# Patient Record
Sex: Male | Born: 1990 | Hispanic: Yes | Marital: Married | Smoking: Current every day smoker
Health system: Southern US, Community
[De-identification: ages and names within clinical notes are randomized; demographics above are authoritative.]

---

## 2013-07-27 ENCOUNTER — Observation Stay (HOSPITAL_COMMUNITY)
Admission: EM | Admit: 2013-07-27 | Discharge: 2013-07-28 | Disposition: A | Discharge status: Home / Self Care | Payer: Self-pay | Attending: General Surgery | Admitting: General Surgery

## 2013-07-27 ENCOUNTER — Emergency Department (HOSPITAL_COMMUNITY): Payer: Self-pay

## 2013-07-27 ENCOUNTER — Encounter (HOSPITAL_COMMUNITY): Payer: Self-pay | Admitting: Emergency Medicine

## 2013-07-27 ENCOUNTER — Emergency Department (HOSPITAL_COMMUNITY): Payer: MEDICAID

## 2013-07-27 DIAGNOSIS — S3681XA Injury of peritoneum, initial encounter: Secondary | ICD-10-CM

## 2013-07-27 DIAGNOSIS — S3600XA Unspecified injury of spleen, initial encounter: Principal | ICD-10-CM | POA: Insufficient documentation

## 2013-07-27 DIAGNOSIS — M25512 Pain in left shoulder: Secondary | ICD-10-CM | POA: Diagnosis present

## 2013-07-27 DIAGNOSIS — M25519 Pain in unspecified shoulder: Secondary | ICD-10-CM | POA: Insufficient documentation

## 2013-07-27 DIAGNOSIS — F17200 Nicotine dependence, unspecified, uncomplicated: Secondary | ICD-10-CM | POA: Insufficient documentation

## 2013-07-27 DIAGNOSIS — S36039A Unspecified laceration of spleen, initial encounter: Secondary | ICD-10-CM

## 2013-07-27 DIAGNOSIS — W138XXA Fall from, out of or through other building or structure, initial encounter: Secondary | ICD-10-CM | POA: Insufficient documentation

## 2013-07-27 DIAGNOSIS — Y990 Civilian activity done for income or pay: Secondary | ICD-10-CM | POA: Insufficient documentation

## 2013-07-27 DIAGNOSIS — S270XXA Traumatic pneumothorax, initial encounter: Secondary | ICD-10-CM

## 2013-07-27 DIAGNOSIS — S3692XA Contusion of unspecified intra-abdominal organ, initial encounter: Secondary | ICD-10-CM

## 2013-07-27 DIAGNOSIS — W19XXXA Unspecified fall, initial encounter: Secondary | ICD-10-CM

## 2013-07-27 DIAGNOSIS — IMO0001 Reserved for inherently not codable concepts without codable children: Secondary | ICD-10-CM

## 2013-07-27 DIAGNOSIS — J939 Pneumothorax, unspecified: Secondary | ICD-10-CM

## 2013-07-27 LAB — COMPREHENSIVE METABOLIC PANEL
ALK PHOS: 66 U/L (ref 39–117)
ALT: 252 U/L — ABNORMAL HIGH (ref 0–53)
AST: 326 U/L — ABNORMAL HIGH (ref 0–37)
Albumin: 4.3 g/dL (ref 3.5–5.2)
BUN: 15 mg/dL (ref 6–23)
CHLORIDE: 105 meq/L (ref 96–112)
CO2: 19 mEq/L (ref 19–32)
CREATININE: 0.87 mg/dL (ref 0.50–1.35)
Calcium: 9.8 mg/dL (ref 8.4–10.5)
GFR calc non Af Amer: >90 mL/min (ref >90)
GLUCOSE: 87 mg/dL (ref 70–99)
POTASSIUM: 3.8 meq/L (ref 3.7–5.3)
Sodium: 145 mEq/L (ref 137–147)
Total Bilirubin: 0.3 mg/dL (ref 0.3–1.2)
Total Protein: 7.9 g/dL (ref 6.0–8.3)

## 2013-07-27 LAB — SAMPLE TO BLOOD BANK

## 2013-07-27 LAB — I-STAT CG4 LACTIC ACID, ED: Lactic Acid, Venous: 2.88 mmol/L — ABNORMAL HIGH (ref 0.5–2.2)

## 2013-07-27 LAB — CBC
HCT: 47.7 % (ref 39.0–52.0)
Hemoglobin: 16.3 g/dL (ref 13.0–17.0)
MCH: 29 pg (ref 26.0–34.0)
MCHC: 34.2 g/dL (ref 30.0–36.0)
MCV: 84.7 fL (ref 78.0–100.0)
Platelets: 320 10*3/uL (ref 150–400)
RBC: 5.63 MIL/uL (ref 4.22–5.81)
RDW: 13.8 % (ref 11.5–15.5)
WBC: 10.9 10*3/uL — AB (ref 4.0–10.5)

## 2013-07-27 LAB — CDS SEROLOGY

## 2013-07-27 LAB — PROTIME-INR
INR: 0.98 (ref 0.00–1.49)
Prothrombin Time: 13 seconds (ref 11.6–15.2)

## 2013-07-27 LAB — ETHANOL: ALCOHOL ETHYL (B): 69 mg/dL — AB (ref 0–11)

## 2013-07-27 MED ORDER — MORPHINE SULFATE 2 MG/ML IJ SOLN: 1.0000 mg | INTRAMUSCULAR | Status: DC | PRN

## 2013-07-27 MED ORDER — MORPHINE SULFATE 2 MG/ML IJ SOLN
2.0000 mg | INTRAMUSCULAR | Status: DC | PRN
  Administered 2013-07-27 – 2013-07-28 (×2): 2 mg via INTRAVENOUS
  Filled 2013-07-27 (×2): qty 1

## 2013-07-27 MED ORDER — MORPHINE SULFATE 4 MG/ML IJ SOLN: 4.0000 mg | INTRAMUSCULAR | Status: DC | PRN

## 2013-07-27 MED ORDER — IOHEXOL 300 MG/ML  SOLN
100.0000 mL | Freq: Once | INTRAMUSCULAR | Status: AC | PRN
  Administered 2013-07-27: 100 mL via INTRAVENOUS

## 2013-07-27 MED ORDER — HYDROMORPHONE HCL PF 1 MG/ML IJ SOLN
INTRAMUSCULAR | Status: AC
  Filled 2013-07-27: qty 1

## 2013-07-27 MED ORDER — POTASSIUM CHLORIDE IN NACL 20-0.9 MEQ/L-% IV SOLN
INTRAVENOUS | Status: DC
  Administered 2013-07-27 – 2013-07-28 (×2): via INTRAVENOUS
  Filled 2013-07-27 (×3): qty 1000

## 2013-07-27 MED ORDER — HYDROMORPHONE HCL PF 1 MG/ML IJ SOLN
1.0000 mg | Freq: Once | INTRAMUSCULAR | Status: AC
Start: 2013-07-27 — End: 2013-07-27
  Administered 2013-07-27: 1 mg via INTRAVENOUS

## 2013-07-27 MED ORDER — ONDANSETRON HCL 4 MG/2ML IJ SOLN
4.0000 mg | Freq: Four times a day (QID) | INTRAMUSCULAR | Status: DC | PRN
  Administered 2013-07-27: 4 mg via INTRAVENOUS
  Filled 2013-07-27: qty 2

## 2013-07-27 MED ORDER — SODIUM CHLORIDE 0.9 % IV BOLUS (SEPSIS): 500.0000 mL | INTRAVENOUS | Status: AC

## 2013-07-27 MED ORDER — ONDANSETRON HCL 4 MG PO TABS: 4.0000 mg | ORAL_TABLET | Freq: Four times a day (QID) | ORAL | Status: DC | PRN

## 2013-07-27 MED ORDER — SODIUM CHLORIDE 0.9 % IV SOLN
INTRAVENOUS | Status: AC | PRN
  Administered 2013-07-27: 500 mL via INTRAVENOUS

## 2013-07-27 NOTE — ED Notes (Signed)
Pt returned to room from radiology, placed back on monitor, family at bedside and updated

## 2013-07-27 NOTE — ED Provider Notes (Signed)
CSN: [id number]     Arrival date & time 07/27/13  1731 History   First MD Initiated Contact with Patient 07/27/13 1741     Chief Complaint  Patient presents with  . Trauma  . Fall     (Consider location/radiation/quality/duration/timing/severity/associated sxs/prior Treatment) Patient is a 23 y.o. male presenting with fall.  Fall This is a new problem. The current episode started today. The problem has been unchanged. Associated symptoms include abdominal pain. Pertinent negatives include no chest pain, chills, congestion, coughing, fever, headaches, neck pain, numbness or vomiting. Nothing aggravates the symptoms. He has tried nothing for the symptoms.    History reviewed. No pertinent past medical history. History reviewed. No pertinent past surgical history. History reviewed. No pertinent family history. History  Substance Use Topics  . Smoking status: Current Every Day Smoker  . Smokeless tobacco: Not on file  . Alcohol Use: Not on file    Review of Systems  Constitutional: Negative for fever and chills.  HENT: Negative for congestion and rhinorrhea.   Eyes: Negative for pain.  Respiratory: Negative for cough and shortness of breath.   Cardiovascular: Negative for chest pain and palpitations.  Gastrointestinal: Positive for abdominal pain. Negative for vomiting, diarrhea and constipation.  Endocrine: Negative for polydipsia and polyuria.  Genitourinary: Negative for dysuria and flank pain.  Musculoskeletal: Negative for back pain and neck pain.  Skin: Negative for color change and wound.  Neurological: Negative for dizziness, numbness and headaches.      Allergies  Review of patient's allergies indicates no known allergies.  Home Medications   Prior to Admission medications   Medication Sig Start Date End Date Taking? Authorizing Provider  traMADol (ULTRAM) 50 MG tablet Take 1-2 tablets (50-100 mg total) by mouth every 6 (six) hours as needed (50mg  for mild pain,  75mg  for moderate pain, 100mg  for severe pain). 07/28/13   Megan Dort, PA-C   BP 114/58  Pulse 58  Temp(Src) 98.1 F (36.7 C) (Oral)  Resp 16  Ht 5\' 8"  (1.727 m)  Wt 170 lb (77.111 kg)  BMI 25.85 kg/m2  SpO2 100% Physical Exam  Nursing note and vitals reviewed. Constitutional: He is oriented to person, place, and time. He appears well-developed and well-nourished.  HENT:  Head: Normocephalic and atraumatic.  Eyes: Conjunctivae and EOM are normal. Pupils are equal, round, and reactive to light.  Neck: Normal range of motion.  Cardiovascular: Normal rate and regular rhythm.   Pulmonary/Chest: Effort normal and breath sounds normal.  Abdominal: Soft. He exhibits no distension. There is tenderness (luq).  Musculoskeletal: Normal range of motion. He exhibits tenderness (left flank). He exhibits no edema.  Neurological: He is alert and oriented to person, place, and time.  Skin: Skin is warm and dry.    ED Course  Procedures (including critical care time) Labs Review Labs Reviewed  COMPREHENSIVE METABOLIC PANEL - Abnormal; Notable for the following:    AST 326 (*)    ALT 252 (*)    All other components within normal limits  CBC - Abnormal; Notable for the following:    WBC 10.9 (*)    All other components within normal limits  ETHANOL - Abnormal; Notable for the following:    Alcohol, Ethyl (B) 69 (*)    All other components within normal limits  URINALYSIS, ROUTINE W REFLEX MICROSCOPIC - Abnormal; Notable for the following:    APPearance CLOUDY (*)    Specific Gravity, Urine 1.044 (*)    Hgb urine dipstick MODERATE (*)  Ketones, ur 15 (*)    All other components within normal limits  CBC - Abnormal; Notable for the following:    WBC 10.9 (*)    All other components within normal limits  I-STAT CG4 LACTIC ACID, ED - Abnormal; Notable for the following:    Lactic Acid, Venous 2.88 (*)    All other components within normal limits  CDS SEROLOGY  PROTIME-INR  BASIC  METABOLIC PANEL  URINE MICROSCOPIC-ADD ON  CBC  SAMPLE TO BLOOD BANK    Imaging Review Dg Ac Joints  07/28/2013   CLINICAL DATA:  Fall.  Left shoulder pain.  EXAM: LEFT ACROMIOCLAVICULAR JOINTS  COMPARISON:  None.  FINDINGS: AC joint imaging of both AC joints with and without weights shows no separation or malalignment. There is no change between the without and with weight images. There are no fractures or bone lesions. No degenerative changes are seen.  IMPRESSION: Negative exam.   Electronically Signed   By: Amie Portlandavid  Ormond M.D.   On: 07/28/2013 11:32   Ct Head Wo Contrast  07/27/2013   CLINICAL DATA:  Larey SeatFell from roof about 20 feet with low back pain and pelvic pain  EXAM: CT HEAD WITHOUT CONTRAST  CT CERVICAL SPINE WITHOUT CONTRAST  TECHNIQUE: Multidetector CT imaging of the head and cervical spine was performed following the standard protocol without intravenous contrast. Multiplanar CT image reconstructions of the cervical spine were also generated.  COMPARISON:  None.  FINDINGS: CT HEAD FINDINGS  No skull fracture. Mild inflammatory change in the maxillary sinuses. Intracranial E there is no evidence of hemorrhage or extra-axial fluid. No mass or infarct. No hydrocephalus.  CT CERVICAL SPINE FINDINGS  No cervical spine alignment. No prevertebral soft tissue swelling. No fracture.  IMPRESSION: Negative CT of the head and cervical spine.   Electronically Signed   By: Esperanza Heiraymond  Rubner M.D.   On: 07/27/2013 18:30   Ct Chest W Contrast  07/27/2013   CLINICAL DATA:  Larey SeatFell from roof  EXAM: CT CHEST, ABDOMEN, AND PELVIS WITH CONTRAST  TECHNIQUE: Multidetector CT imaging of the chest, abdomen and pelvis was performed following the standard protocol during bolus administration of intravenous contrast.  CONTRAST:  100mL OMNIPAQUE IOHEXOL 300 MG/ML  SOLN  COMPARISON:  None.  FINDINGS: CT CHEST FINDINGS  No pleural effusion identified. Dependent changes are noted in the left base. There is a tiny left anterior  pneumothorax. No pulmonary contusion.  The heart size is normal. No mediastinal hematoma. There is no enlarged mediastinal or hilar lymph nodes. No enlarged axillary or supraclavicular lymph nodes.  Review of the visualized bony structures is negative for fracture or dislocation.  CT ABDOMEN AND PELVIS FINDINGS  There is no focal liver abnormality. The gallbladder appears normal. No biliary dilatation. Normal appearance of the pancreas. Several tiny foci of low-attenuation are identified within the parenchyma of the spleen vertically within the inferior aspect of the spleen. There is a small amount of fluid identified along the inferior margin of the spleen which is best seen on the coronal images. Findings are suspicious for splenic laceration or contusion, image 54/series 5.  The adrenal glands are both normal. Normal appearance of both kidneys. The urinary bladder appears normal. The prostate gland and seminal vesicles are normal.  Normal caliber of the abdominal aorta. No aneurysm. No upper abdominal adenopathy. There is no pelvic or inguinal adenopathy.  The stomach is normal. The small bowel loops have a normal course and caliber. The appendix is visualized and appears  normal. Focal area of increased attenuation within the omentum fat of the right abdomen along the undersurface of the ventral abdominal wall is identified measuring 1.3 x 2.7 cm, image 75/series 2. The colon is otherwise on unremarkable.  Review of the visualized osseous structures is negative for acute fracture.  IMPRESSION: 1. Tiny left anterior pneumothorax. 2. There is a focal area increased attenuation omentum within the right abdomen is favored to represent sequelae of contusion and hematoma formation. 3. Suspect mild splenic laceration or contusion with trace amount of hemo peritoneum.  Critical Value/emergent results were called by telephone at the time of interpretation on 07/27/2013 at 6:42 PM to Dr. Randa Spike, Romeo Apple , who verbally  acknowledged these results.   Electronically Signed   By: Signa Kell M.D.   On: 07/27/2013 18:42   Ct Cervical Spine Wo Contrast  07/27/2013   CLINICAL DATA:  Larey Seat from roof about 20 feet with low back pain and pelvic pain  EXAM: CT HEAD WITHOUT CONTRAST  CT CERVICAL SPINE WITHOUT CONTRAST  TECHNIQUE: Multidetector CT imaging of the head and cervical spine was performed following the standard protocol without intravenous contrast. Multiplanar CT image reconstructions of the cervical spine were also generated.  COMPARISON:  None.  FINDINGS: CT HEAD FINDINGS  No skull fracture. Mild inflammatory change in the maxillary sinuses. Intracranial E there is no evidence of hemorrhage or extra-axial fluid. No mass or infarct. No hydrocephalus.  CT CERVICAL SPINE FINDINGS  No cervical spine alignment. No prevertebral soft tissue swelling. No fracture.  IMPRESSION: Negative CT of the head and cervical spine.   Electronically Signed   By: Esperanza Heir M.D.   On: 07/27/2013 18:30   Ct Abdomen Pelvis W Contrast  07/27/2013   CLINICAL DATA:  Larey Seat from roof  EXAM: CT CHEST, ABDOMEN, AND PELVIS WITH CONTRAST  TECHNIQUE: Multidetector CT imaging of the chest, abdomen and pelvis was performed following the standard protocol during bolus administration of intravenous contrast.  CONTRAST:  OMNIPAQUE IOHEXOL 300 MG/ML  SOLN  COMPARISON:  None.  FINDINGS: CT CHEST FINDINGS  No pleural effusion identified. Dependent changes are noted in the left base. There is a tiny left anterior pneumothorax. No pulmonary contusion.  The heart size is normal. No mediastinal hematoma. There is no enlarged mediastinal or hilar lymph nodes. No enlarged axillary or supraclavicular lymph nodes.  Review of the visualized bony structures is negative for fracture or dislocation.  CT ABDOMEN AND PELVIS FINDINGS  There is no focal liver abnormality. The gallbladder appears normal. No biliary dilatation. Normal appearance of the pancreas. Several  tiny foci of low-attenuation are identified within the parenchyma of the spleen vertically within the inferior aspect of the spleen. There is a small amount of fluid identified along the inferior margin of the spleen which is best seen on the coronal images. Findings are suspicious for splenic laceration or contusion, image 54/series 5.  The adrenal glands are both normal. Normal appearance of both kidneys. The urinary bladder appears normal. The prostate gland and seminal vesicles are normal.  Normal caliber of the abdominal aorta. No aneurysm. No upper abdominal adenopathy. There is no pelvic or inguinal adenopathy.  The stomach is normal. The small bowel loops have a normal course and caliber. The appendix is visualized and appears normal. Focal area of increased attenuation within the omentum fat of the right abdomen along the undersurface of the ventral abdominal wall is identified measuring 1.3 x 2.7 cm, image 75/series 2. The colon is otherwise  on unremarkable.  Review of the visualized osseous structures is negative for acute fracture.  IMPRESSION: 1. Tiny left anterior pneumothorax. 2. There is a focal area increased attenuation omentum within the right abdomen is favored to represent sequelae of contusion and hematoma formation. 3. Suspect mild splenic laceration or contusion with trace amount of hemo peritoneum.  Critical Value/emergent results were called by telephone at the time of interpretation on 07/27/2013 at 6:42 PM to Dr. Randa SpikeFORREST, Romeo AppleHARRISON , who verbally acknowledged these results.   Electronically Signed   By: Signa Kellaylor  Stroud M.D.   On: 07/27/2013 18:42   Dg Pelvis Portable  07/27/2013   CLINICAL DATA:  Trauma.  Pelvic pain.  EXAM: PORTABLE PELVIS 1-2 VIEWS  COMPARISON:  None.  FINDINGS: There is no evidence of pelvic fracture or diastasis. No other pelvic bone lesions are seen.  IMPRESSION: Negative.   Electronically Signed   By: Myles RosenthalJohn  Stahl M.D.   On: 07/27/2013 18:49   Dg Chest Port 1  View  07/28/2013   CLINICAL DATA:  LEFT apical pneumothorax, patient feeling better  EXAM: PORTABLE CHEST - 1 VIEW  COMPARISON:  Portable exam 0727 hr compared to 07/27/2013  FINDINGS: Upper normal heart size.  Normal mediastinal contours and pulmonary vascularity.  Lungs clear.  No pulmonary infiltrate, pleural effusion, or pneumothorax.  Bones unremarkable.  IMPRESSION: No acute abnormalities.   Electronically Signed   By: Ulyses SouthwardMark  Boles M.D.   On: 07/28/2013 08:28   Dg Chest Port 1 View  07/27/2013   CLINICAL DATA:  Trauma.  EXAM: PORTABLE CHEST - 1 VIEW  COMPARISON:  None.  FINDINGS: Low lung volumes are noted, however both lungs are clear. No evidence of pneumothorax or hemothorax. No evidence of mediastinal widening or tracheal deviation. Heart size is within normal limits. Visualized skeletal structures are unremarkable.  IMPRESSION: Low lung volumes.  No acute findings.   Electronically Signed   By: Myles RosenthalJohn  Stahl M.D.   On: 07/27/2013 18:39   Dg Shoulder Left  07/28/2013   CLINICAL DATA:  Pain s/p fall  EXAM: LEFT SHOULDER - 2+ VIEW  COMPARISON:  None.  FINDINGS: There is no evidence of fracture or dislocation. There is no evidence of arthropathy or other focal bone abnormality. Soft tissues are unremarkable.  IMPRESSION: Negative.   Electronically Signed   By: Salome HolmesHector  Cooper M.D.   On: 07/28/2013 10:59     EKG Interpretation   Date/Time:  Tuesday July 27 2013 17:46:50 EDT Ventricular Rate:  94 PR Interval:  175 QRS Duration: 108 QT Interval:  328 QTC Calculation: 410 R Axis:   52 Text Interpretation:  Sinus rhythm Anterior infarct, acute (LAD) Lateral  leads are also involved ST elevation likely early repol in healthy young  male, no previous for comparison Confirmed by HARRISON  MD, FORREST ([id number])  on 07/27/2013 6:41:35 PM      MDM   Final diagnoses:  Fall, initial encounter  Pneumothorax, left  Splenic laceration, initial encounter  Abdominal hematoma, initial encounter    23  yo M w/o significant PMH here after 30 foot fall without LOC, but has amnesia to event, landed on abdomen and chest. HDS, Pain in luq and left lower back, nothing midline. Neuro grossly intact. Significant epigastric and luq ttp.  Imaging with ptx, splenic lac, ffp. Admitted to trauma in stable condition.     Marily MemosJason Mesner, MD 07/28/13 [id number]

## 2013-07-27 NOTE — H&P (Signed)
History   Perry [name] is an 23 y.o. male.   Chief Complaint:  Chief Complaint  Patient presents with  . Trauma  . Fall    Trauma Mechanism of injury: fall  Fall  this is [name] 23 year old Hispanic gentleman who apparently fell off [name] approximately 30 feet while working. He arrived as [name] level II trauma, hemodynamically stable, complaining of back and abdominal pain. He denies neck pain or shortness. There was no loss of consciousness. GCS is 15. He does not speak [place] but interpreters are present.  History reviewed. No pertinent past medical history.  History reviewed. No pertinent past surgical history.  History reviewed. No pertinent family history. Social History:  reports that he has been smoking.  He does not have any smokeless tobacco history on file. His alcohol and drug histories are not on file.  Allergies  No Known Allergies  Home Medications   (Not in [name] hospital admission)  Trauma Course   Results for orders placed during the hospital encounter of 07/27/13 (from the past 48 hour(s))  CDS SEROLOGY     Status: None   Collection Time    07/27/13  5:41 PM      Result Value Ref Range   CDS serology specimen       Value: SPECIMEN WILL BE HELD FOR 14 DAYS IF TESTING IS REQUIRED  COMPREHENSIVE METABOLIC PANEL     Status: Abnormal   Collection Time    07/27/13  5:41 PM      Result Value Ref Range   Sodium 145  137 - 147 mEq/L   Potassium 3.8  3.7 - 5.3 mEq/L   Chloride 105  96 - 112 mEq/L   CO2 19  19 - 32 mEq/L   Glucose, Bld 87  70 - 99 mg/dL   BUN 15  6 - 23 mg/dL   Creatinine, Ser 0.87  0.50 - 1.35 mg/dL   Calcium 9.8  8.4 - 10.5 mg/dL   Total Protein 7.9  6.0 - 8.3 g/dL   Albumin 4.3  3.5 - 5.2 g/dL   AST 326 (*) 0 - 37 U/L   ALT 252 (*) 0 - 53 U/L   Alkaline Phosphatase 66  39 - 117 U/L   Total Bilirubin 0.3  0.3 - 1.2 mg/dL   GFR calc non Af Amer >90  >90 mL/min   GFR calc Af Amer >90  >90 mL/min   Comment: (NOTE)     The eGFR has been  calculated using the CKD EPI equation.     This calculation has not been validated in all clinical situations.     eGFR's persistently <90 mL/min signify possible Chronic Kidney     Disease.  CBC     Status: Abnormal   Collection Time    07/27/13  5:41 PM      Result Value Ref Range   WBC 10.9 (*) 4.0 - 10.5 K/uL   RBC 5.63  4.22 - 5.81 MIL/uL   Hemoglobin 16.3  13.0 - 17.0 g/dL   HCT 47.7  39.0 - 52.0 %   MCV 84.7  78.0 - 100.0 fL   MCH 29.0  26.0 - 34.0 pg   MCHC 34.2  30.0 - 36.0 g/dL   RDW 13.8  11.5 - 15.5 %   Platelets 320  150 - 400 K/uL  ETHANOL     Status: Abnormal   Collection Time    07/27/13  5:41 PM      Result  Value Ref Range   Alcohol, Ethyl (B) 69 (*) 0 - 11 mg/dL   Comment:            LOWEST DETECTABLE LIMIT FOR     SERUM ALCOHOL IS 11 mg/dL     FOR MEDICAL PURPOSES ONLY  PROTIME-INR     Status: None   Collection Time    07/27/13  5:41 PM      Result Value Ref Range   Prothrombin Time 13.0  11.6 - 15.2 seconds   INR 0.98  0.00 - 1.49  SAMPLE TO BLOOD BANK     Status: None   Collection Time    07/27/13  5:41 PM      Result Value Ref Range   Blood Bank Specimen SAMPLE AVAILABLE FOR TESTING     Sample Expiration 07/28/2013    I-STAT CG4 LACTIC ACID, ED     Status: Abnormal   Collection Time    07/27/13  5:53 PM      Result Value Ref Range   Lactic Acid, Venous 2.88 (*) 0.5 - 2.2 mmol/L   Ct Head Wo Contrast  07/27/2013   CLINICAL DATA:  Fell from roof about 20 feet with low back pain and pelvic pain  EXAM: CT HEAD WITHOUT CONTRAST  CT CERVICAL SPINE WITHOUT CONTRAST  TECHNIQUE: Multidetector CT imaging of the head and cervical spine was performed following the standard protocol without intravenous contrast. Multiplanar CT image reconstructions of the cervical spine were also generated.  COMPARISON:  None.  FINDINGS: CT HEAD FINDINGS  No skull fracture. Mild inflammatory change in the maxillary sinuses. Intracranial E there is no evidence of hemorrhage or  extra-axial fluid. No mass or infarct. No hydrocephalus.  CT CERVICAL SPINE FINDINGS  No cervical spine alignment. No prevertebral soft tissue swelling. No fracture.  IMPRESSION: Negative CT of the head and cervical spine.   Electronically Signed   By: Skipper Cliche M.D.   On: 07/27/2013 18:30   Ct Chest W Contrast  07/27/2013   CLINICAL DATA:  [place] from roof  EXAM: CT CHEST, ABDOMEN, AND PELVIS WITH CONTRAST  TECHNIQUE: Multidetector CT imaging of the chest, abdomen and pelvis was performed following the standard protocol during bolus administration of intravenous contrast.  CONTRAST:  137m OMNIPAQUE IOHEXOL 300 MG/ML  SOLN  COMPARISON:  None.  FINDINGS: CT CHEST FINDINGS  No pleural effusion identified. Dependent changes are noted in the left base. There is [name] tiny left anterior pneumothorax. No pulmonary contusion.  The heart size is normal. No mediastinal hematoma. There is no enlarged mediastinal or hilar lymph nodes. No enlarged axillary or supraclavicular lymph nodes.  Review of the visualized bony structures is negative for fracture or dislocation.  CT ABDOMEN AND PELVIS FINDINGS  There is no focal liver abnormality. The gallbladder appears normal. No biliary dilatation. Normal appearance of the pancreas. Several tiny foci of low-attenuation are identified within the parenchyma of the spleen vertically within the inferior aspect of the spleen. There is [name] small amount of fluid identified along the inferior margin of the spleen which is best seen on the coronal images. Findings are suspicious for splenic laceration or contusion, image 54/series 5.  The adrenal glands are both normal. Normal appearance of both kidneys. The urinary bladder appears normal. The prostate gland and seminal vesicles are normal.  Normal caliber of the abdominal aorta. No aneurysm. No upper abdominal adenopathy. There is no pelvic or inguinal adenopathy.  The stomach is normal. The small bowel loops have  [name] normal course and  caliber. The appendix is visualized and appears normal. Focal area of increased attenuation within the omentum fat of the right abdomen along the undersurface of the ventral abdominal wall is identified measuring 1.3 x 2.7 cm, image 75/series 2. The colon is otherwise on unremarkable.  Review of the visualized osseous structures is negative for acute fracture.  IMPRESSION: 1. Tiny left anterior pneumothorax. 2. There is [name] focal area increased attenuation omentum within the right abdomen is favored to represent sequelae of contusion and hematoma formation. 3. Suspect mild splenic laceration or contusion with trace amount of hemo peritoneum.  Critical Value/emergent results were called by telephone at the time of interpretation on 07/27/2013 at 6:42 PM to Dr. Shea Evans, Aline Brochure , who verbally acknowledged these results.   Electronically Signed   By: Kerby Moors M.D.   On: 07/27/2013 18:42   Ct Cervical Spine Wo Contrast  07/27/2013   CLINICAL DATA:  [place] from roof about 20 feet with low back pain and pelvic pain  EXAM: CT HEAD WITHOUT CONTRAST  CT CERVICAL SPINE WITHOUT CONTRAST  TECHNIQUE: Multidetector CT imaging of the head and cervical spine was performed following the standard protocol without intravenous contrast. Multiplanar CT image reconstructions of the cervical spine were also generated.  COMPARISON:  None.  FINDINGS: CT HEAD FINDINGS  No skull fracture. Mild inflammatory change in the maxillary sinuses. Intracranial E there is no evidence of hemorrhage or extra-axial fluid. No mass or infarct. No hydrocephalus.  CT CERVICAL SPINE FINDINGS  No cervical spine alignment. No prevertebral soft tissue swelling. No fracture.  IMPRESSION: Negative CT of the head and cervical spine.   Electronically Signed   By: Skipper Cliche M.D.   On: 07/27/2013 18:30   Ct Abdomen Pelvis W Contrast  07/27/2013   CLINICAL DATA:  [place] from roof  EXAM: CT CHEST, ABDOMEN, AND PELVIS WITH CONTRAST  TECHNIQUE: Multidetector CT  imaging of the chest, abdomen and pelvis was performed following the standard protocol during bolus administration of intravenous contrast.  CONTRAST:  171m OMNIPAQUE IOHEXOL 300 MG/ML  SOLN  COMPARISON:  None.  FINDINGS: CT CHEST FINDINGS  No pleural effusion identified. Dependent changes are noted in the left base. There is [name] tiny left anterior pneumothorax. No pulmonary contusion.  The heart size is normal. No mediastinal hematoma. There is no enlarged mediastinal or hilar lymph nodes. No enlarged axillary or supraclavicular lymph nodes.  Review of the visualized bony structures is negative for fracture or dislocation.  CT ABDOMEN AND PELVIS FINDINGS  There is no focal liver abnormality. The gallbladder appears normal. No biliary dilatation. Normal appearance of the pancreas. Several tiny foci of low-attenuation are identified within the parenchyma of the spleen vertically within the inferior aspect of the spleen. There is [name] small amount of fluid identified along the inferior margin of the spleen which is best seen on the coronal images. Findings are suspicious for splenic laceration or contusion, image 54/series 5.  The adrenal glands are both normal. Normal appearance of both kidneys. The urinary bladder appears normal. The prostate gland and seminal vesicles are normal.  Normal caliber of the abdominal aorta. No aneurysm. No upper abdominal adenopathy. There is no pelvic or inguinal adenopathy.  The stomach is normal. The small bowel loops have [name] normal course and caliber. The appendix is visualized and appears normal. Focal area of increased attenuation within the omentum fat of the right abdomen along the undersurface of the ventral abdominal wall is identified measuring  1.3 x 2.7 cm, image 75/series 2. The colon is otherwise on unremarkable.  Review of the visualized osseous structures is negative for acute fracture.  IMPRESSION: 1. Tiny left anterior pneumothorax. 2. There is [name] focal area increased  attenuation omentum within the right abdomen is favored to represent sequelae of contusion and hematoma formation. 3. Suspect mild splenic laceration or contusion with trace amount of hemo peritoneum.  Critical Value/emergent results were called by telephone at the time of interpretation on 07/27/2013 at 6:42 PM to Dr. Shea Evans, Aline Brochure , who verbally acknowledged these results.   Electronically Signed   By: Kerby Moors M.D.   On: 07/27/2013 18:42   Dg Pelvis Portable  07/27/2013   CLINICAL DATA:  Trauma.  Pelvic pain.  EXAM: PORTABLE PELVIS 1-2 VIEWS  COMPARISON:  None.  FINDINGS: There is no evidence of pelvic fracture or diastasis. No other pelvic bone lesions are seen.  IMPRESSION: Negative.   Electronically Signed   By: Earle Gell M.D.   On: 07/27/2013 18:49   Dg Chest Port 1 View  07/27/2013   CLINICAL DATA:  Trauma.  EXAM: PORTABLE CHEST - 1 VIEW  COMPARISON:  None.  FINDINGS: Low lung volumes are noted, however both lungs are clear. No evidence of pneumothorax or hemothorax. No evidence of mediastinal widening or tracheal deviation. Heart size is within normal limits. Visualized skeletal structures are unremarkable.  IMPRESSION: Low lung volumes.  No acute findings.   Electronically Signed   By: Earle Gell M.D.   On: 07/27/2013 18:39    Review of Systems  All other systems reviewed and are negative.   Blood pressure 115/68, pulse 96, temperature 98.4 F (36.9 C), temperature source Oral, resp. rate 18, height 5' 8"  (1.727 m), weight 170 lb (77.111 kg), SpO2 97.00%. Physical Exam  Constitutional: He is oriented to person, place, and time. He appears well-developed and well-nourished. No distress.  HENT:  Head: Normocephalic and atraumatic.  Right Ear: External ear normal.  Left Ear: External ear normal.  Nose: Nose normal.  Mouth/Throat: Oropharynx is clear and moist. No oropharyngeal exudate.  Eyes: Conjunctivae are normal. Pupils are equal, round, and reactive to light. Right eye  exhibits no discharge. Left eye exhibits no discharge. No scleral icterus.  Neck: Normal range of motion. Neck supple. No tracheal deviation present.  Cervical spine is nontender  Cardiovascular: Normal rate, regular rhythm, normal heart sounds and intact distal pulses.   No murmur heard. Respiratory: Effort normal and breath sounds normal. No respiratory distress. He has no wheezes.  GI: Soft. He exhibits no distension. There is tenderness. There is guarding.  There is mild central tenderness with minimal guarding. There are no abrasions or lacerations  Musculoskeletal: Normal range of motion. He exhibits no edema and no tenderness.  No obvious long bone abnormalities  Lymphadenopathy:    He has no cervical adenopathy.  Neurological: He is alert and oriented to person, place, and time.  Skin: Skin is warm and dry. No rash noted. No erythema.  Psychiatric: His behavior is normal.   pelvis: Stable to rock Back: Nontender  Assessment/Plan Patient status post fall with the following injuries:  #1. Left tiny apical pneumothorax #2. Grade 1 splenic laceration with minimal intra-abdominal hematoma #3. Intra-abdominal contusion  He will be admitted to the hospital for serial abdominal examinations. His chest x-ray and labs will be repeated in the morning. I will lead him at bedrest and npo given the CAT scan findings.  Says his abdominal exam  change, he may need an exploratory laparotomy. I explained this to the patient in detail through an interpreter.  Perry [name] 07/27/2013, 7:12 PM   Procedures

## 2013-07-27 NOTE — ED Notes (Signed)
Pt to radiology.

## 2013-07-27 NOTE — ED Notes (Signed)
Attempted to call report

## 2013-07-27 NOTE — ED Notes (Signed)
Pt in via EMS to trauma room, pt fell approx 30 feet from a roof he was working on, possible LOC, pt c/o lower back pain and LUQ abdominal pain, alert and oriented, initial GCS of 15, pt on LSB with c-collar in place on arrival

## 2013-07-28 ENCOUNTER — Observation Stay (HOSPITAL_COMMUNITY): Payer: Self-pay

## 2013-07-28 DIAGNOSIS — S3681XA Injury of peritoneum, initial encounter: Secondary | ICD-10-CM

## 2013-07-28 DIAGNOSIS — S36039A Unspecified laceration of spleen, initial encounter: Secondary | ICD-10-CM

## 2013-07-28 DIAGNOSIS — S270XXA Traumatic pneumothorax, initial encounter: Secondary | ICD-10-CM

## 2013-07-28 DIAGNOSIS — M25512 Pain in left shoulder: Secondary | ICD-10-CM | POA: Diagnosis present

## 2013-07-28 LAB — CBC
HCT: 41.8 % (ref 39.0–52.0)
HEMATOCRIT: 41.9 % (ref 39.0–52.0)
HEMOGLOBIN: 14.2 g/dL (ref 13.0–17.0)
Hemoglobin: 14.1 g/dL (ref 13.0–17.0)
MCH: 29 pg (ref 26.0–34.0)
MCH: 28.9 pg (ref 26.0–34.0)
MCHC: 34 g/dL (ref 30.0–36.0)
MCHC: 33.7 g/dL (ref 30.0–36.0)
MCV: 85.3 fL (ref 78.0–100.0)
MCV: 85.9 fL (ref 78.0–100.0)
Platelets: 292 10*3/uL (ref 150–400)
Platelets: 271 10*3/uL (ref 150–400)
RBC: 4.9 MIL/uL (ref 4.22–5.81)
RBC: 4.88 MIL/uL (ref 4.22–5.81)
RDW: 14.1 % (ref 11.5–15.5)
RDW: 14.1 % (ref 11.5–15.5)
WBC: 10.9 10*3/uL — ABNORMAL HIGH (ref 4.0–10.5)
WBC: 10 10*3/uL (ref 4.0–10.5)

## 2013-07-28 LAB — URINALYSIS, ROUTINE W REFLEX MICROSCOPIC
Bilirubin Urine: NEGATIVE
GLUCOSE, UA: NEGATIVE mg/dL
KETONES UR: 15 mg/dL — AB
LEUKOCYTES UA: NEGATIVE
Nitrite: NEGATIVE
PH: 5 (ref 5.0–8.0)
Protein, ur: NEGATIVE mg/dL
Specific Gravity, Urine: 1.044 — ABNORMAL HIGH (ref 1.005–1.030)
Urobilinogen, UA: 0.2 mg/dL (ref 0.0–1.0)

## 2013-07-28 LAB — URINE MICROSCOPIC-ADD ON

## 2013-07-28 LAB — BASIC METABOLIC PANEL
BUN: 15 mg/dL (ref 6–23)
CHLORIDE: 104 meq/L (ref 96–112)
CO2: 21 mEq/L (ref 19–32)
CREATININE: 0.76 mg/dL (ref 0.50–1.35)
Calcium: 9 mg/dL (ref 8.4–10.5)
GFR calc Af Amer: >90 mL/min (ref >90)
GFR calc non Af Amer: >90 mL/min (ref >90)
GLUCOSE: 86 mg/dL (ref 70–99)
POTASSIUM: 4.3 meq/L (ref 3.7–5.3)
Sodium: 141 mEq/L (ref 137–147)

## 2013-07-28 MED ORDER — MORPHINE SULFATE 2 MG/ML IJ SOLN: 2.0000 mg | INTRAMUSCULAR | Status: DC | PRN

## 2013-07-28 MED ORDER — TRAMADOL HCL 50 MG PO TABS
50.0000 mg | ORAL_TABLET | Freq: Four times a day (QID) | ORAL | Status: DC | PRN
  Administered 2013-07-28: 100 mg via ORAL
  Filled 2013-07-28: qty 2

## 2013-07-28 MED ORDER — TRAMADOL HCL 50 MG PO TABS: 50.0000 mg | ORAL_TABLET | Freq: Four times a day (QID) | ORAL | Status: DC | PRN

## 2013-07-28 NOTE — Progress Notes (Signed)
Patient ID: Perry GardenerLuis Islas-Meza, male   DOB: 10-Dec-1990, 23 y.o.   MRN: [id number]   LOS: 1 day   Subjective: C/o right waist and left shoulder pain. Minimal abd pain. Denies N/V.   Objective: Vital signs in last 24 hours: Temp:  [98 F (36.7 C)-98.5 F (36.9 C)] 98 F (36.7 C) (07/01 0512) Pulse Rate:  [72-101] 80 (07/01 0512) Resp:  [13-21] 16 (07/01 0512) BP: (100-132)/(46-84) 100/46 mmHg (07/01 0512) SpO2:  [95 %-100 %] 100 % (07/01 0512) Weight:  [170 lb (77.111 kg)] 170 lb (77.111 kg) (06/30 1736) Last BM Date: 07/27/13   Laboratory  CBC  Recent Labs  07/27/13 1741 07/28/13 0413  WBC 10.9* 10.9*  HGB 16.3 14.2  HCT 47.7 41.8  PLT 320 292   BMET  Recent Labs  07/27/13 1741 07/28/13 0413  NA 145 141  K 3.8 4.3  CL 105 104  CO2 19 21  GLUCOSE 87 86  BUN 15 15  CREATININE 0.87 0.76  CALCIUM 9.8 9.0    Radiology Results PORTABLE CHEST - 1 VIEW  COMPARISON: Portable exam 0727 hr compared to 07/27/2013  FINDINGS:  Upper normal heart size.  Normal mediastinal contours and pulmonary vascularity.  Lungs clear.  No pulmonary infiltrate, pleural effusion, or pneumothorax.  Bones unremarkable.  IMPRESSION:  No acute abnormalities.  Electronically Signed  By: Ulyses SouthwardMark Boles M.D.  On: 07/28/2013 08:28   Physical Exam General appearance: alert and no distress Resp: clear to auscultation bilaterally Cardio: regular rate and rhythm GI: normal findings: bowel sounds normal and soft, non-tender Extremities: Left shoulder TTP AC joint and posteriorly   Assessment/Plan: Fall Left PTX -- No significant extension on CXR this morning Left shoulder pain -- Will get x-rays. Kerr's sign possible but seems joint related. Grade 1 splenic laceration -- Hgb down overnight but still in normal range, recheck this afternoon after patient ambulates Omental contusion -- No s/sx of occult bowel injury, give diet. FEN -- SL IV, orals for pain VTE -- SCD's Dispo -- Possibly home  this afternoon depending on how the day goes.    Freeman CaldronMichael J. Tamikka Pilger, PA-C Pager: [phone] General Trauma PA Pager: [phone]  07/28/2013

## 2013-07-28 NOTE — Progress Notes (Signed)
Discharge instructions reviewed with pt, sister, and pt's girlfriend. Pt's girlfriend and sister both speak english and verbalized understanding. Rx was given to pt's family by Charma IgoMichael Jeffery, PA prior to discharge instructions but the Rx was reviewed with family during instructions. They have no further questions at this time and are ready for discharge.

## 2013-07-28 NOTE — Progress Notes (Signed)
UR completed 

## 2013-07-28 NOTE — Progress Notes (Signed)
Patient should be able to go home later today.  Doubt Kerr's sign.    This patient has been seen and I agree with the findings and treatment plan.  Marta LamasJames O. Gae BonWyatt, III, MD, FACS [phone] (pager) [phone] (direct pager) Trauma Surgeon

## 2013-07-28 NOTE — Discharge Instructions (Signed)
Neumotrax (Pneumothorax) Un neumotrax, comnmente llamado pulmn colapsado, es una afeccin en la que se filtra aire de un pulmn y se acumula en el espacio entre el pulmn y la pared torcica (espacio pleural). El aire en un neumotrax est atrapado fuera del pulmn y ocupa espacio, y esto le impide al pulmn expandirse por completo. Este problema suele aparecer rpidamente. La acumulacin de aire puede ser pequea o grande. Un neumotrax pequeo puede desaparecer solo. Cuando un neumotrax es ms grande suele necesitar tratamiento mdico y hospitalizacin.  CAUSAS  A veces, un neumotrax puede formarse rpidamente sin causa aparente. Las personas que tienen problemas de [employer], en particular EPOC o enfisema, tienen un riesgo mayor de tener un neumotrax. No obstante, un neumotrax puede formarse rpidamente incluso en personas sin problemas pulmonares conocidos. Los traumatismos, cirugas, procedimientos mdicos o lesiones en la pared torcica tambin pueden provocar un neumotrax. SIGNOS Y SNTOMAS  En ocasiones, el neumotrax no tiene sntomas. Si se presentan sntomas, estos pueden ser:  Journalist, newspaper.  Falta de aire.  Frecuencia respiratoria aumentada.  Color azulado en los labios o la piel (cianosis). DIAGNSTICO  Por lo general, el neumotrax se diagnostica mediante una radiografa o una tomografa computada de trax. El mdico le har una historia clnica y un examen fsico para determinar por qu puede tener un neumotrax. TRATAMIENTO  Un neumotrax pequeo puede desaparecer solo sin tratamiento. A veces, oxgeno extra puede colaborar para que un neumotrax pequeo desparezca con mayor rapidez. En el caso de un neumotrax ms grande o que causa sntomas, suele ser necesario un procedimiento para Dealer. En algunos casos, el mdico puede drenar el aire utilizando [place]. En otros, se puede introducir un tubo pleural en el espacio pleural. Un tubo pleural es un  tubo pequeo que se coloca entre las costillas en el espacio pleural. El tubo elimina el aire extra y le permite al pulmn volver a expandirse hasta su tamao normal. Un neumotrax grande, por lo general, necesita hospitalizacin. Si existe filtracin de aire constante en el espacio pleural, es posible que el tubo pleural se deba dejar colocado durante varios das [employer]. En algunos casos, podra necesitarse [place].  INSTRUCCIONES PARA EL CUIDADO EN EL HOGAR   Tome solo medicamentos de venta libre o recetados, segn las indicaciones del mdico.  Si el dolor o la tos le dificultan el sueo por la noche, trate de dormir en posicin semierguida en una reposera o [employer] o tres Mallory.  Limite las actividades segn las indicaciones del mdico.  Si le haban colocado un tubo pleural y este fue retirado, consulte con su mdico cundo es el mejor momento para retirar el vendaje. Hasta que el mdico le diga que puede retirar el vendaje, no permita que se moje.  No fume. Fumar es un factor de riesgo para el neumotrax.  No viaje en avin ni nade bajo el agua hasta que su mdico le permita hacerlo.  Concurra a las consultas de control con su mdico segn las indicaciones. SOLICITE ATENCIN MDICA DE INMEDIATO SI:   Siente falta de aire o dolor en el pecho cada vez ms intenso.  Tiene una tos que no se puede controlar con supresores.  Comienza a escupir sangre al toser.  Siente un dolor que va en aumento o que no puede controlar con los medicamentos.  Elimina moco ms espeso, (esputo) de color amarillo o verde.  Tiene enrojecimiento, dolor cada vez ms intenso o una secrecin  en el lugar donde se coloc el tubo pleural (en caso de que se haya tratado el neumotrax con un tubo pleural).  Se abre el lugar donde se coloc el tubo pleural.  Siente que sale aire del lugar donde se coloc el tubo pleural.  Tiene fiebre o sntomas persistentes durante ms de 2a  3das.  Tiene fiebre y los sntomas empeoran repentinamente. ASEGRESE DE QUE:   Comprende estas instrucciones.  Controlar su afeccin.  Recibir ayuda de inmediato si no mejora o si empeora. Document Released: 10/24/2004 Document Revised: 11/04/2012 [facility] Patient Information 2015 [place] VillageExitCare, MarylandLLC. This information is not intended to replace advice given to you by your health care provider. Make sure you discuss any questions you have with your health care provider.   Lesin esplnica  (Splenic Injury)  Una lesin esplnica es una lesin en el bazo. El bazo es un rgano situado en la parte superior izquierda del abdomen, justo debajo de las Bankscostillas. El bazo filtra y limpia la Rhinelandsangre. Tambin almacena las clulas sanguneas y destruye las clulas que estn desgastadas. Interviene en la lucha contra las enfermedades. Sin embargo, cuando Chartered certified accountantel bazo se extirpa, todo el cuerpo puede Industrial/product designerluchar contra la mayora de las enfermedades.  CAUSAS  Un golpe en el abdomen puede lesionar el bazo. En algunos casos, el bazo forma un hematoma por sangrado en el interior y a su alrededor. Sin embargo, en algunos casos un traumatismo hace que se rompa (ruptura). Ciertas enfermedades tambin pueden hacer que el bazo se agrande y se rompa. Debido a que el bazo recibe una gran cantidad de Summitvillesangre, Neomia Dearuna ruptura es un problema muy grave y Ship brokerpuede poner en peligro la vida.  SNTOMAS  A menudo, una lesin esplnica menor no causa ningn sntoma o slo dolor abdominal de menor importancia. Cuando el sangrado es abundante, la presin arterial puede disminuir rpidamente. Esto puede causar BorgWarneralgunos de los siguientes problemas:  Mareos o aturdimiento.  Latidos cardacos acelerados.  Sensibilidad e hinchazn abdominal.  Desmayos.  Sudoracin con piel fra y hmeda. DIAGNSTICO  El mdico podr conocer de inmediato el problema tomando la historia clnica y el examen fsico. Si hay tiempo, el diagnstico se confirma  generalmente con una tomografa computarizada. Podrn indicarle otras pruebas por imgenes como por ejemplo ecografas o resonancia magntica. Las pruebas de laboratorio se indican para Air cabin crewanalizar la sangre y puede ser necesario hacerlas con frecuencia durante Time Warneralgunos das despus de la lesin.  TRATAMIENTO   Si la presin es muy baja, se podr necesitar una intervencin quirrgica de emergencia.  Cuando las lesiones son menos graves, el cirujano puede decidir observar la lesin, o tratarla radiologa intervencionista. La radiologa intervencionista utiliza tubos flexibles (catteres) para detener el sangrado del interior del vaso sanguneo. Esto slo se puede hacer bajo determinadas circunstancias. Su mdico le dir si esto es una opcin.  Puede ser necesario que permanezca uno a varios das en una unidad de cuidados intensivos [place](UCI). Se controlar cuidadosamente los Plainviewniveles de lquido y Beverly Hillssangre. En algunos casos se necesitan lquidos por va intravenosa (IV) y transfusiones de Tajikistansangre. Para controlar la evolucin del bazo se indicarn estudios de control. El bazo puede curarse por s mismo. Sin embargo, si los Kenaiproblemas empeoran, Delawarepuede ser necesaria [place] Herz[place]ovinauna ciruga. INSTRUCCIONES PARA EL CUIDADO EN EL HOGAR   Cumpla con todas las visitas de control, segn las instrucciones de su mdico. Incluso cuando el bazo parezca haber sanado bien, el cirujano puede continuar realizando controles de la lesin.  Consulte a su mdico si  necesita alguna vacuna. Puede necesitar ciertas vacunas segn haya tenido una ciruga, radiologa intervencionista, o algn otro tratamiento para la lesin del bazo. SOLICITE ATENCIN MDICA DE INMEDIATO SI:   Tiene fiebre.  El dolor abdominal empeora.  Tiene signos de infeccin, como escalofros o Dentistmalestar. ASEGRESE DE QUE:   Comprende estas instrucciones.  Controlar su enfermedad.  Solicitar ayuda de inmediato si no mejora o si empeora. Document Released: 09/26/2010  Document Revised: 04/08/2011 [facility] Patient Information 2015 [place]aneberryExitCare, MarylandLLC. This information is not intended to replace advice given to you by your health care provider. Make sure you discuss any questions you have with your health care provider.

## 2013-07-28 NOTE — Discharge Summary (Signed)
Physician Discharge Summary  Patient ID: Perry GardenerLuis [name] MRN: [id number] DOB/AGE: 02/12/1990 23 y.o.  Admit date: 07/27/2013 Discharge date: 07/28/2013  Discharge Diagnoses Patient Active Problem List   Diagnosis Date Noted  . Pneumothorax, traumatic 07/28/2013  . Splenic laceration 07/28/2013  . Left shoulder pain 07/28/2013  . Injury of omentum 07/28/2013  . Fall 07/27/2013    Consultants None   Procedures None   HPI: [name] fell off a roof approximately 30 feet while working. He arrived as a level II trauma, hemodynamically stable, complaining of back and abdominal pain. His workup included CT scans of the head, cervical spine, chest, abdomen, and pelvis and showed the above-mentioned injuries. He was admitted to the trauma service for observation.   Hospital Course: The patient did well in the hospital. His hemoglobin dropped initially though stabilized in the low normal range. A repeat chest x-ray was stable the following day. He was able to tolerate a regular diet and his pain was controlled on oral medications. He was discharged home in good condition in the care of his family.      Medication List         traMADol 50 MG tablet  Commonly known as:  ULTRAM  Take 1-2 tablets (50-100 mg total) by mouth every 6 (six) hours as needed (50mg  for mild pain, 75mg  for moderate pain, 100mg  for severe pain).             Follow-up Information   Call [facility]. (As needed)    Contact information:   [address] [place] [address] [phone]       Follow up with  [facility]    . Schedule an appointment as soon as possible for a visit in 2 weeks. (Make an appointment with a primary care/family practice provider.)    Contact information:   [address] [place] [address] [phone]      Signed: Freeman CaldronMichael J. Kahmari Herard, PA-C Pager: [phone] General Trauma PA Pager: [phone] 07/28/2013, 4:28 PM

## 2013-07-30 NOTE — ED Provider Notes (Signed)
Medical screening examination/treatment/procedure(s) were conducted as a shared visit with resident physician and myself.  I personally evaluated the patient during the encounter.   I interviewed and examined the patient. Lungs are CTAB. Cardiac exam wnl. Abdomen soft w/ ttp of LUQ. Pt found to have small ptx and likely small splenic lac. Trauma has seen and will admit for obs.   Junius ArgyleForrest S Harrison, MD 07/30/13 1149

## 2014-06-16 ENCOUNTER — Encounter (HOSPITAL_COMMUNITY): Payer: Self-pay | Admitting: Adult Health

## 2014-06-16 ENCOUNTER — Emergency Department (HOSPITAL_COMMUNITY)
Admission: EM | Admit: 2014-06-16 | Discharge: 2014-06-17 | Disposition: A | Discharge status: Home / Self Care | Payer: Self-pay | Attending: Emergency Medicine | Admitting: Emergency Medicine

## 2014-06-16 DIAGNOSIS — Z720 Tobacco use: Secondary | ICD-10-CM | POA: Insufficient documentation

## 2014-06-16 DIAGNOSIS — J029 Acute pharyngitis, unspecified: Secondary | ICD-10-CM | POA: Insufficient documentation

## 2014-06-16 LAB — CBC WITH DIFFERENTIAL/PLATELET
BASOS ABS: 0 10*3/uL (ref 0.0–0.1)
Basophils Relative: 0 % (ref 0–1)
EOS PCT: 1 % (ref 0–5)
Eosinophils Absolute: 0.1 10*3/uL (ref 0.0–0.7)
HCT: 42.8 % (ref 39.0–52.0)
Hemoglobin: 14.7 g/dL (ref 13.0–17.0)
LYMPHS ABS: 1.4 10*3/uL (ref 0.7–4.0)
Lymphocytes Relative: 10 % — ABNORMAL LOW (ref 12–46)
MCH: 28.1 pg (ref 26.0–34.0)
MCHC: 34.3 g/dL (ref 30.0–36.0)
MCV: 81.7 fL (ref 78.0–100.0)
MONO ABS: 1.4 10*3/uL — AB (ref 0.1–1.0)
MONOS PCT: 10 % (ref 3–12)
Neutro Abs: 10.9 10*3/uL — ABNORMAL HIGH (ref 1.7–7.7)
Neutrophils Relative %: 79 % — ABNORMAL HIGH (ref 43–77)
Platelets: 330 10*3/uL (ref 150–400)
RBC: 5.24 MIL/uL (ref 4.22–5.81)
RDW: 12.8 % (ref 11.5–15.5)
WBC: 13.8 10*3/uL — ABNORMAL HIGH (ref 4.0–10.5)

## 2014-06-16 LAB — COMPREHENSIVE METABOLIC PANEL
ALBUMIN: 3.9 g/dL (ref 3.5–5.0)
ALK PHOS: 63 U/L (ref 38–126)
ALT: 27 U/L (ref 17–63)
AST: 22 U/L (ref 15–41)
Anion gap: 11 (ref 5–15)
BUN: 8 mg/dL (ref 6–20)
CO2: 21 mmol/L — AB (ref 22–32)
CREATININE: 1.01 mg/dL (ref 0.61–1.24)
Calcium: 8.7 mg/dL — ABNORMAL LOW (ref 8.9–10.3)
Chloride: 102 mmol/L (ref 101–111)
GFR calc non Af Amer: >60 mL/min (ref >60)
GLUCOSE: 118 mg/dL — AB (ref 65–99)
Potassium: 3.7 mmol/L (ref 3.5–5.1)
SODIUM: 134 mmol/L — AB (ref 135–145)
Total Bilirubin: 0.5 mg/dL (ref 0.3–1.2)
Total Protein: 7.7 g/dL (ref 6.5–8.1)

## 2014-06-16 LAB — RAPID STREP SCREEN (MED CTR MEBANE ONLY): Streptococcus, Group A Screen (Direct): NEGATIVE

## 2014-06-16 LAB — I-STAT CG4 LACTIC ACID, ED: LACTIC ACID, VENOUS: 0.59 mmol/L (ref 0.5–2.0)

## 2014-06-16 MED ORDER — IBUPROFEN 400 MG PO TABS
600.0000 mg | ORAL_TABLET | Freq: Once | ORAL | Status: AC
  Administered 2014-06-16: 600 mg via ORAL
  Filled 2014-06-16: qty 2

## 2014-06-16 MED ORDER — ACETAMINOPHEN 500 MG PO TABS
1000.0000 mg | ORAL_TABLET | Freq: Once | ORAL | Status: DC
  Filled 2014-06-16: qty 2

## 2014-06-16 NOTE — ED Provider Notes (Signed)
CSN: [id number]     Arrival date & time 06/16/14  2112 History  This chart was scribed for [facility] Neese, NP, working with Arby BarretteMarcy Pfeiffer, MD by Elon SpannerGarrett Cook, ED Scribe. This patient was seen in room TR08C/TR08C and the patient's care was started at 10:10 PM.   Chief Complaint  Patient presents with  . Fever  . Sore Throat   Patient is a 24 y.o. male presenting with pharyngitis. The history is provided by the patient. No language interpreter was used.  Sore Throat   HPI Comments: Perry GardenerLuis [name] is a 24 y.o. male who presents to the Emergency Department complaining of a fever onset last night.  He also reports an associated sore throat onset yesterday, generalized body aches onset today, and one episode of loose stool upon arrival to ED.  He has taken Tylenol at 8:00 pm and 800 mg ibuprofen at 12:00 pm today without relief.   He reports a prior history of sore throat but has never had an episode like today with sore throat in conjunction with fever.  He was seen yesterday at an unknown health clinic where he received a negative strep test and was discharged home with a prescription for 800 mg ibuprofen.  He denies sick contacts.  He denies vomiting, headache, ear ache, cough, congestion.     History reviewed. No pertinent past medical history. History reviewed. No pertinent past surgical history. History reviewed. No pertinent family history. History  Substance Use Topics  . Smoking status: Current Every Day Smoker  . Smokeless tobacco: Not on file  . Alcohol Use: Not on file    Review of Systems  Constitutional: Positive for fever.  HENT: Positive for sore throat.   Musculoskeletal: Positive for myalgias.  All other systems reviewed and are negative.     Allergies  Review of patient's allergies indicates no known allergies.  Home Medications   Prior to Admission medications   Medication Sig Start Date End Date Taking? Authorizing Provider  traMADol (ULTRAM) 50 MG tablet Take  1-2 tablets (50-100 mg total) by mouth every 6 (six) hours as needed (50mg  for mild pain, 75mg  for moderate pain, 100mg  for severe pain). 07/28/13   Megan N Dort, PA-C   BP 117/77 mmHg  Pulse 120  Temp(Src) 103.1 F (39.5 C) (Oral)  Resp 24  SpO2 96% Physical Exam  Constitutional: He is oriented to person, place, and time. He appears well-developed and well-nourished. No distress.  HENT:  Head: Normocephalic.  Right Ear: Tympanic membrane normal.  Left Ear: Tympanic membrane normal.  Nose: Nose normal.  Mouth/Throat: Mucous membranes are normal. Posterior oropharyngeal erythema present.  Left tonsil enlarged without exudate, there is erythema.   Eyes: EOM are normal.  Neck: Neck supple.  Cardiovascular: Normal rate.   Tachycardic.  Pulmonary/Chest: Effort normal. No respiratory distress. He has no wheezes. He has no rales.  Lungs CTA  Abdominal: Soft. Bowel sounds are normal. There is no tenderness.  Musculoskeletal: Normal range of motion.  Lymphadenopathy:    He has cervical adenopathy.  Neurological: He is alert and oriented to person, place, and time. No cranial nerve deficit.  Skin: Skin is warm and dry.  Psychiatric: He has a normal mood and affect. His behavior is normal.  Nursing note and vitals reviewed.   ED Course  Procedures (including critical care time)  DIAGNOSTIC STUDIES: Oxygen Saturation is 96% on RA, normal by my interpretation.    COORDINATION OF CARE:  10:15 PM Discussed treatment plan with  patient at bedside.  Patient acknowledges and agrees with plan.    Labs Review Results for orders placed or performed during the hospital encounter of 06/16/14 (from the past 24 hour(s))  Rapid strep screen   (If patient has fever and/or without cough or runny nose)     Status: None   Collection Time: 06/16/14  9:30 PM  Result Value Ref Range   Streptococcus, Group A Screen (Direct) NEGATIVE NEGATIVE  Comprehensive metabolic panel     Status: Abnormal    Collection Time: 06/16/14  9:39 PM  Result Value Ref Range   Sodium 134 (L) 135 - 145 mmol/L   Potassium 3.7 3.5 - 5.1 mmol/L   Chloride 102 101 - 111 mmol/L   CO2 21 (L) 22 - 32 mmol/L   Glucose, Bld 118 (H) 65 - 99 mg/dL   BUN 8 6 - 20 mg/dL   Creatinine, Ser 0.451.01 0.61 - 1.24 mg/dL   Calcium 8.7 (L) 8.9 - 10.3 mg/dL   Total Protein 7.7 6.5 - 8.1 g/dL   Albumin 3.9 3.5 - 5.0 g/dL   AST 22 15 - 41 U/L   ALT 27 17 - 63 U/L   Alkaline Phosphatase 63 38 - 126 U/L   Total Bilirubin 0.5 0.3 - 1.2 mg/dL   GFR calc non Af Amer >60 >60 mL/min   GFR calc Af Amer >60 >60 mL/min   Anion gap 11 5 - 15  CBC with Differential     Status: Abnormal   Collection Time: 06/16/14  9:39 PM  Result Value Ref Range   WBC 13.8 (H) 4.0 - 10.5 K/uL   RBC 5.24 4.22 - 5.81 MIL/uL   Hemoglobin 14.7 13.0 - 17.0 g/dL   HCT 40.942.8 81.139.0 - 91.452.0 %   MCV 81.7 78.0 - 100.0 fL   MCH 28.1 26.0 - 34.0 pg   MCHC 34.3 30.0 - 36.0 g/dL   RDW 78.212.8 95.611.5 - 21.315.5 %   Platelets 330 150 - 400 K/uL   Neutrophils Relative % 79 (H) 43 - 77 %   Neutro Abs 10.9 (H) 1.7 - 7.7 K/uL   Lymphocytes Relative 10 (L) 12 - 46 %   Lymphs Abs 1.4 0.7 - 4.0 K/uL   Monocytes Relative 10 3 - 12 %   Monocytes Absolute 1.4 (H) 0.1 - 1.0 K/uL   Eosinophils Relative 1 0 - 5 %   Eosinophils Absolute 0.1 0.0 - 0.7 K/uL   Basophils Relative 0 0 - 1 %   Basophils Absolute 0.0 0.0 - 0.1 K/uL  I-Stat CG4 Lactic Acid, ED (Not at [facility] or [facility])     Status: None   Collection Time: 06/16/14  9:47 PM  Result Value Ref Range   Lactic Acid, Venous 0.59 0.5 - 2.0 mmol/L   @ 2255 Care turned over to [facility] SurgeryJosh Geiple, [facility], with remainder of Labs pending.   MDM  I personally performed the services described in this documentation, which was scribed in my presence. The recorded information has been reviewed and is accurate.    [place]ards[place]Hope M Neese, NP 06/16/14 2300  Arby BarretteMarcy Pfeiffer, MD 06/22/14 [phone]

## 2014-06-16 NOTE — ED Notes (Signed)
Patient does have swelling to the left side of his throat also redness on both side. The patient denies any sputum,  Or exudate coming out of his mouth.

## 2014-06-16 NOTE — ED Notes (Addendum)
Presents with sore throat, diarrhea and fever of 104.0 at home associated with lower back pain-pt took 1,000mg  of tylenol at 8:30 and had 800 mg of ibuprofen at 5 pm today-fever is 103.1 here. Throat red-no exudate-he is spanish speaking. Able to drink water- HR 120

## 2014-06-17 LAB — URINALYSIS, ROUTINE W REFLEX MICROSCOPIC
Bilirubin Urine: NEGATIVE
GLUCOSE, UA: NEGATIVE mg/dL
Hgb urine dipstick: NEGATIVE
KETONES UR: NEGATIVE mg/dL
Leukocytes, UA: NEGATIVE
Nitrite: NEGATIVE
Protein, ur: NEGATIVE mg/dL
Specific Gravity, Urine: 1.015 (ref 1.005–1.030)
Urobilinogen, UA: 0.2 mg/dL (ref 0.0–1.0)
pH: 5.5 (ref 5.0–8.0)

## 2014-06-17 LAB — MONONUCLEOSIS SCREEN: Mono Screen: NEGATIVE

## 2014-06-17 MED ORDER — IBUPROFEN 600 MG PO TABS: 600.0000 mg | ORAL_TABLET | Freq: Four times a day (QID) | ORAL | Status: DC | PRN

## 2014-06-17 NOTE — ED Provider Notes (Signed)
12:55 [facility] at shift change. Patient awaiting remainder of lab results. Mono screen is negative. Strep test is negative. Patient does have a leukocytosis. Electrolytes are otherwise unremarkable. I reviewed the results with patient and family at bedside. I offered a Decadron injection to help with swelling and inflammation, patient declines.  Patient is currently taking Augmentin prescribed by the PCP he saw previously. I encouraged him to continue these if he was prescribed these by another provider. I encouraged continued Tylenol and ibuprofen for symptom control.  Patient encouraged to hydrate well, eat soft foods while his throat is sore. Patient encouraged to return with high persistent fever, trouble swallowing, difficulty breathing, swelling of his neck, or any other concerns. Patient counseled to rest for the next several days. He declines work note.  Exam: Gen NAD; ENT bilateral swollen erythematous tonsils without exudate, no peritonsillar or tonsillar abscesses noted, uvula midline, normal voice. Heart RRR, nml S1,S2, no m/r/g; Lungs CTAB; Abd soft, NT, no rebound or guarding; Ext 2+ pedal pulses bilaterally, no edema.    Renne CriglerJoshua Avereigh Spainhower, PA-C 06/17/14 0058  Layla MawKristen N Ward, DO 06/17/14 [id number]

## 2014-06-17 NOTE — ED Notes (Signed)
PA at bedside.

## 2014-06-17 NOTE — Discharge Instructions (Signed)
Please read and follow all provided instructions.  Your diagnoses today include:  1. Pharyngitis     Tests performed today include:  Strep test: was negative for strep throat  Strep culture: you will be notified if this comes back positive  Mono test: Negative  Blood counts and electrolytes-elevated infection fighting cells  Lactic acid level - no severe infection  Vital signs. See below for your results today.   Medications prescribed:   Ibuprofen (Motrin, Advil) - anti-inflammatory pain medication  Do not exceed 600mg  ibuprofen every 6 hours, take with food  You have been prescribed an anti-inflammatory medication or NSAID. Take with food. Take smallest effective dose for the shortest duration needed for your pain. Stop taking if you experience stomach pain or vomiting.   Home care instructions:  Please read the educational materials provided and follow any instructions contained in this packet.  Follow-up instructions: Please follow-up with your primary care provider as needed for further evaluation of your symptoms.  Return instructions:   Please return to the Emergency Department if you experience worsening symptoms.   Return if you have worsening problems swallowing, your neck becomes swollen, you cannot swallow your saliva or your voice becomes muffled.   Return with high persistent fever, persistent vomiting, or if you have trouble breathing.   Please return if you have any other emergent concerns.  Additional Information:  Your vital signs today were: BP 107/64 mmHg   Pulse 89   Temp(Src) 99.5 F (37.5 C) (Oral)   Resp 18   SpO2 99% If your blood pressure (BP) was elevated above 135/85 this visit, please have this repeated by your doctor within one month. --------------

## 2014-06-19 LAB — CULTURE, GROUP A STREP: Strep A Culture: NEGATIVE

## 2014-08-04 ENCOUNTER — Inpatient Hospital Stay (HOSPITAL_COMMUNITY)
Admission: EM | Admit: 2014-08-04 | Discharge: 2014-08-06 | DRG: 316 | Disposition: A | Discharge status: Home / Self Care | Payer: Self-pay | Attending: Internal Medicine | Admitting: Internal Medicine

## 2014-08-04 ENCOUNTER — Emergency Department (HOSPITAL_COMMUNITY): Payer: Self-pay

## 2014-08-04 ENCOUNTER — Encounter (HOSPITAL_COMMUNITY): Payer: Self-pay | Admitting: *Deleted

## 2014-08-04 ENCOUNTER — Other Ambulatory Visit: Payer: Self-pay

## 2014-08-04 DIAGNOSIS — J020 Streptococcal pharyngitis: Secondary | ICD-10-CM | POA: Diagnosis present

## 2014-08-04 DIAGNOSIS — R778 Other specified abnormalities of plasma proteins: Secondary | ICD-10-CM

## 2014-08-04 DIAGNOSIS — R7989 Other specified abnormal findings of blood chemistry: Secondary | ICD-10-CM | POA: Diagnosis present

## 2014-08-04 DIAGNOSIS — I214 Non-ST elevation (NSTEMI) myocardial infarction: Secondary | ICD-10-CM | POA: Diagnosis present

## 2014-08-04 DIAGNOSIS — Z87891 Personal history of nicotine dependence: Secondary | ICD-10-CM

## 2014-08-04 DIAGNOSIS — I309 Acute pericarditis, unspecified: Principal | ICD-10-CM

## 2014-08-04 DIAGNOSIS — R079 Chest pain, unspecified: Secondary | ICD-10-CM | POA: Diagnosis present

## 2014-08-04 LAB — CBC
HCT: 42.1 % (ref 39.0–52.0)
Hemoglobin: 14.8 g/dL (ref 13.0–17.0)
MCH: 28.3 pg (ref 26.0–34.0)
MCHC: 35.2 g/dL (ref 30.0–36.0)
MCV: 80.5 fL (ref 78.0–100.0)
PLATELETS: 324 10*3/uL (ref 150–400)
RBC: 5.23 MIL/uL (ref 4.22–5.81)
RDW: 13.3 % (ref 11.5–15.5)
WBC: 8.3 10*3/uL (ref 4.0–10.5)

## 2014-08-04 LAB — BASIC METABOLIC PANEL
ANION GAP: 11 (ref 5–15)
BUN: 11 mg/dL (ref 6–20)
CO2: 22 mmol/L (ref 22–32)
Calcium: 9.3 mg/dL (ref 8.9–10.3)
Chloride: 102 mmol/L (ref 101–111)
Creatinine, Ser: 0.79 mg/dL (ref 0.61–1.24)
GFR calc Af Amer: >60 mL/min (ref >60)
GFR calc non Af Amer: >60 mL/min (ref >60)
Glucose, Bld: 126 mg/dL — ABNORMAL HIGH (ref 65–99)
Potassium: 3.7 mmol/L (ref 3.5–5.1)
Sodium: 135 mmol/L (ref 135–145)

## 2014-08-04 LAB — RAPID URINE DRUG SCREEN, HOSP PERFORMED
Amphetamines: NOT DETECTED
Barbiturates: NOT DETECTED
Benzodiazepines: NOT DETECTED
Cocaine: NOT DETECTED
Opiates: NOT DETECTED
Tetrahydrocannabinol: NOT DETECTED

## 2014-08-04 LAB — I-STAT TROPONIN, ED
Troponin i, poc: 1.56 ng/mL (ref 0.00–0.08)
Troponin i, poc: 1.74 ng/mL (ref 0.00–0.08)

## 2014-08-04 LAB — RAPID STREP SCREEN (MED CTR MEBANE ONLY): Streptococcus, Group A Screen (Direct): POSITIVE — AB

## 2014-08-04 LAB — TROPONIN I: Troponin I: 2.92 ng/mL (ref <0.031)

## 2014-08-04 MED ORDER — AMOXICILLIN 500 MG PO CAPS
500.0000 mg | ORAL_CAPSULE | Freq: Two times a day (BID) | ORAL | Status: DC
  Administered 2014-08-04 – 2014-08-05 (×2): 500 mg via ORAL
  Filled 2014-08-04 (×3): qty 1

## 2014-08-04 MED ORDER — CETYLPYRIDINIUM CHLORIDE 0.05 % MT LIQD: 7.0000 mL | OROMUCOSAL | Status: DC | PRN

## 2014-08-04 MED ORDER — MAGIC MOUTHWASH W/LIDOCAINE
5.0000 mL | Freq: Four times a day (QID) | ORAL | Status: DC | PRN
  Administered 2014-08-04: 5 mL via ORAL
  Filled 2014-08-04 (×2): qty 5

## 2014-08-04 MED ORDER — HYDROCODONE-ACETAMINOPHEN 5-325 MG PO TABS
2.0000 | ORAL_TABLET | ORAL | Status: DC | PRN
  Administered 2014-08-04 – 2014-08-05 (×3): 2 via ORAL
  Filled 2014-08-04 (×3): qty 2

## 2014-08-04 MED ORDER — SODIUM CHLORIDE 0.9 % IJ SOLN
3.0000 mL | Freq: Two times a day (BID) | INTRAMUSCULAR | Status: DC
  Administered 2014-08-04 – 2014-08-05 (×3): 3 mL via INTRAVENOUS

## 2014-08-04 MED ORDER — PENICILLIN G BENZATHINE 1200000 UNIT/2ML IM SUSP
1.2000 10*6.[IU] | Freq: Once | INTRAMUSCULAR | Status: AC
  Administered 2014-08-04: 1.2 10*6.[IU] via INTRAMUSCULAR
  Filled 2014-08-04: qty 2

## 2014-08-04 NOTE — ED Notes (Signed)
Pt denies CP at this time 

## 2014-08-04 NOTE — ED Notes (Signed)
Pt c/o chest pain since 1730.  Recently tx for pharyngitis which cleared up with prednisone that a family member gave him.  Pt started amoxy last night that a family member gave him b/c the sore throat came back.

## 2014-08-04 NOTE — ED Notes (Signed)
MD aware of elevated troponin

## 2014-08-04 NOTE — ED Notes (Signed)
EKG shown to Dr Micheline Mazeocherty.

## 2014-08-04 NOTE — ED Notes (Signed)
PA notified of critical troponin 

## 2014-08-04 NOTE — H&P (Signed)
[facility] History and Physical  Perry GardenerLuis [name] ZOX:[id number] DOB: 19-Jan-1991 DOA: 08/04/2014  Referring physician: Champ Mungochris lawyer,PA PCP: No PCP Per Patient   Chief Complaint:  Sore throat with fever x 2 days  chest pain x 12 day  HPI:  24 year old Hispanic male with no past medical history presented to the ED with sore throat and fever for the past 3 days. Patient reports having a temperature of 102 F this morning. He reports some difficulty swallowing. He reports nausea with an episode of small amount of vomiting as well. This evening he developed substernal and right-sided chest pain which was about 7/10, nonradiating without any aggravating or relieving factors. He reports the chest and to have subsided by the time he came to the ED. He denies any sick contacts or recent travel. Patient denies headache, dizziness,  palpitations, SOB, abdominal pain, bowel or urinary symptoms. Denies change in weight or appetite. Denies prior similar chest pain symptoms or family history of heart disease  Course  in the ED Patient had low-grade temperature. Blood work done was unremarkable. He had progressively elevated troponin of up to 2.92. EKG short nonspecific ST elevation in inferior and lateral leads (on reviewing  unchanged from prior EKG tracing). Chest x-ray was unremarkable. Urine drug screen was negative. Cardiology was consulted who recommended admission on the hospitalist service and they would consult. Patient admitted to stepdown for close monitoring.  Review of Systems:  As outlined in history of presenting illness. 12 point ROS otherwise unremarkable.   Past medical history Nonsignificant for fall with splenic laceration in 2015  Past surgical history None  History reviewed. No pertinent past surgical history. Social History: quit smoking few years back, drinks beer occasionally, denies  illicit drug use  No Known Allergies  Emily history No family history of coronary  artery disease, heart failure or sudden cardiac death   Prior to Admission medications   None      Physical Exam:  Filed Vitals:   08/04/14 2115 08/04/14 2130 08/04/14 2145 08/04/14 2200  BP: 115/73 117/70 111/68 108/76  Pulse: 72 68 70 73  Temp:      TempSrc:      Resp: 22 21 22 20   Height:      Weight:      SpO2: 99% 99% 99% 99%    Constitutional: Vital signs reviewed.  Young male in no acute distress HEENT: no pallor, no icterus, moist oral mucosa, enlarged b/l tonsils, no exudates, cervical  lymphadenoathy Cardiovascular: RRR, S1 normal, S2 normal, no MRG Chest: CTAB, no wheezes, rales, or rhonchi Abdominal: Soft. Non-tender, non-distended, bowel sounds are normal, Ext: warm, no edema Neurological: A&O x3, non focal  Labs on Admission:  Basic Metabolic Panel:  Recent Labs Lab 08/04/14 1820  NA 135  K 3.7  CL 102  CO2 22  GLUCOSE 126*  BUN 11  CREATININE 0.79  CALCIUM 9.3   Liver Function Tests: No results for input(s): AST, ALT, ALKPHOS, BILITOT, PROT, ALBUMIN in the last 168 hours. No results for input(s): LIPASE, AMYLASE in the last 168 hours. No results for input(s): AMMONIA in the last 168 hours. CBC:  Recent Labs Lab 08/04/14 1820  WBC 8.3  HGB 14.8  HCT 42.1  MCV 80.5  PLT 324   Cardiac Enzymes:  Recent Labs Lab 08/04/14 2036  TROPONINI 2.92*   BNP: Invalid input(s): POCBNP CBG: No results for input(s): GLUCAP in the last 168 hours.  Radiological Exams on Admission: Dg Chest 2 View  08/04/2014   CLINICAL DATA:  24 year old male with chest pain  EXAM: CHEST  2 VIEW  COMPARISON:  Radiograph dated 07/28/2013  FINDINGS: The heart size and mediastinal contours are within normal limits. Both lungs are clear. The visualized skeletal structures are unremarkable.  IMPRESSION: No active cardiopulmonary disease.   Electronically Signed   By: Elgie Collard M.D.   On: 08/04/2014 19:25    EKG: Normal sinus rhythm at 75 with nonspecific ST  elevation in inferior lateral leads  Assessment/Plan  Principal Problem:   NSTEMI (non-ST elevated myocardial infarction) Admit to stepdown. History and exam not very convincing for acute MI, given underlying strep throat with fever. Possible for acute myopericarditis. Chest pain appears atypical and mainly localized on the right side. Urine for drug screen negative. Check serial troponins and EKG. -place on full dose aspirin and IV heparin. Check 2-D echo. Prn vicodin for pain ( pt chest pain free since arrival to ED) -Cardiology Consulted by ED physician who will see the patient.   Active Problems:   Strep pharyngitis Rapid strep positive in ED. Pt given a shot of benzathine PCN in ED. Will place him on oral amoxicillin .ordered magic mouthwash with lidocaine. Tylenol prn  for fever. zofran prn for nausea or vomiting.     Diet:npo  DVT prophylaxis: IV heparin   Code Status: full code Family Communication: discussed with wife at bedside Disposition Plan:admit to stepdown  Eddie North [facility] Pager [phone]  Total time spent on admission :70 minutes  If 7PM-7AM, please contact night-coverage [email or web address] Password [email or web address] 08/04/2014, 10:11 PM

## 2014-08-04 NOTE — Consult Note (Addendum)
CARDIOLOGY CONSULT NOTE   Patient ID: Perry [name] MRN: [id number], DOB/AGE: 22-Dec-1990   Admit date: 08/04/2014 Date of Consult: 08/04/2014   Primary Physician: No PCP Per Patient Primary Cardiologist: None  Pt. Profile  60M p/w 3d sore throat, fevers, and CP and is found to have + strep and + Tn c/w myopericarditis  Problem List  History reviewed. No pertinent past medical history.  History reviewed. No pertinent past surgical history.   Allergies  No Known Allergies  HPI   24 year old healthy Spanish speaking male who p/w 3d sore throat, fevers, and CP.  Three days ago, he developed swollen throat. Today, he developed a fever to 102F. He took a tylenol for the pain and shortly after developed right sided chest pain "like someone punched me in the chest." No associated symptoms, including SOB, PND, orthopnea, edema. Pain was improved with ambulation and was not modulated by position or inspiration. The pain lasted for a total of 30 minutes. No prior episodes.   On arrival to the ER, he was hemodynamically stable. HR 76, BP 111/81, 99% on RA. 24F. Labs notable for K 3.7, Cr 0.79, POC TnI 1.56-->1.74 and TnI 2.92. WBC 8.3.  Rapid strep +. CXR demonstrated No active cardiopulmonary disease. ECG demonstrated NSR iRBBB. diffuse STE, most pronounced in inferior leads. PR elevation in aVR. Compared to prior on 07/28/14, STE slightly more pronounced. He was given IV bicillin and started on amoxicillin  Inpatient Medications    Family History No family history on file.   Social History History   Social History  . Marital Status: Married    Spouse Name: N/A  . Number of Children: N/A  . Years of Education: N/A   Occupational History  . Not on file.   Social History Main Topics  . Smoking status: Never Smoker   . Smokeless tobacco: Not on file  . Alcohol Use: No  . Drug Use: No  . Sexual Activity: Not on file   Other Topics Concern  . Not on file   Social History  Narrative     Review of Systems  General:  No chills, night sweats or weight changes. + fevers Cardiovascular:  See HPI Dermatological: No rash, lesions/masses Respiratory: No cough, dyspnea Urologic: No hematuria, dysuria Abdominal:   No nausea, vomiting, diarrhea, bright red blood per rectum, melena, or hematemesis Neurologic:  No visual changes, wkns, changes in mental status. All other systems reviewed and are otherwise negative except as noted above.  Physical Exam  Blood pressure 109/74, pulse 70, temperature 99 F (37.2 C), temperature source Oral, resp. rate 20, height 5' 5"  (1.651 m), weight 70.761 kg (156 lb), SpO2 99 %.  General: Pleasant, NAD Psych: Normal affect. Neuro: Alert and oriented X 3. Moves all extremities spontaneously. HEENT: Normal  Neck: Supple without bruits or JVD. Lungs:  Resp regular and unlabored, CTA. Heart: RRR no s3, s4, or murmurs. Abdomen: Soft, non-tender, non-distended, BS + x 4.  Extremities: No clubbing, cyanosis or edema. DP/PT/Radials 2+ and equal bilaterally.  Labs   Recent Labs  08/04/14 2036  TROPONINI 2.92*   Lab Results  Component Value Date   WBC 8.3 08/04/2014   HGB 14.8 08/04/2014   HCT 42.1 08/04/2014   MCV 80.5 08/04/2014   PLT 324 08/04/2014    Recent Labs Lab 08/04/14 1820  NA 135  K 3.7  CL 102  CO2 22  BUN 11  CREATININE 0.79  CALCIUM 9.3  GLUCOSE 126*  No results found for: CHOL, HDL, LDLCALC, TRIG No results found for: DDIMER  Radiology/Studies  Dg Chest 2 View  08/04/2014   CLINICAL DATA:  24 year old male with chest pain  EXAM: CHEST  2 VIEW  COMPARISON:  Radiograph dated 07/28/2013  FINDINGS: The heart size and mediastinal contours are within normal limits. Both lungs are clear. The visualized skeletal structures are unremarkable.  IMPRESSION: No active cardiopulmonary disease.   Electronically Signed   By: Anner Crete M.D.   On: 08/04/2014 19:25    ECG   08/04/14 @ 18:08: NSR iRBBB.  diffuse STE, most pronounced in inferior leads. PR elevation in aVR. Compared to prior on 07/28/14, STE slightly more pronounced.   ASSESSMENT AND PLAN  24M p/w 3d sore throat, fevers, and CP and is found to have + strep and + Tn c/w myopericarditis. CP is not classic for acute pericarditis but with + TnI and subtle ECG changes, this is the most likely diagnosis. Could theoretically be early manifestation of acute rheumatic fever although he does not have arthritis, CNS manifestation, or rash. ACS is unlikely in this patient give age and lack of risk factors although is a theoretical possibility.   - CMR in AM to assess for EF, rWMA, LGE in coronary distribution, tissue edema. If no edema is noted and/or coronary distribution scar is noted, will need to consider cardiac cath - ESR/CRP - BNP - trend TnI; OK to hold off on heparin - Consider colchicine/NSAIDS if MRI confirms myopericarditis. Will hold off at this time as he is CP free and definite dx is still in question.   Tobi Bastos, MD 08/04/2014, 10:45 PM

## 2014-08-05 ENCOUNTER — Encounter (HOSPITAL_COMMUNITY): Payer: Self-pay | Admitting: Physician Assistant

## 2014-08-05 ENCOUNTER — Inpatient Hospital Stay (HOSPITAL_COMMUNITY): Payer: Self-pay

## 2014-08-05 DIAGNOSIS — R079 Chest pain, unspecified: Secondary | ICD-10-CM

## 2014-08-05 DIAGNOSIS — B950 Streptococcus, group A, as the cause of diseases classified elsewhere: Secondary | ICD-10-CM

## 2014-08-05 DIAGNOSIS — J020 Streptococcal pharyngitis: Secondary | ICD-10-CM

## 2014-08-05 DIAGNOSIS — R7989 Other specified abnormal findings of blood chemistry: Secondary | ICD-10-CM

## 2014-08-05 DIAGNOSIS — R509 Fever, unspecified: Secondary | ICD-10-CM

## 2014-08-05 DIAGNOSIS — I309 Acute pericarditis, unspecified: Secondary | ICD-10-CM

## 2014-08-05 DIAGNOSIS — R0781 Pleurodynia: Secondary | ICD-10-CM

## 2014-08-05 DIAGNOSIS — Z87891 Personal history of nicotine dependence: Secondary | ICD-10-CM

## 2014-08-05 LAB — SEDIMENTATION RATE: Sed Rate: 35 mm/hr — ABNORMAL HIGH (ref 0–16)

## 2014-08-05 LAB — TROPONIN I
TROPONIN I: 2.26 ng/mL — AB (ref <0.031)
Troponin I: 3.22 ng/mL (ref <0.031)
Troponin I: 2.71 ng/mL (ref <0.031)

## 2014-08-05 LAB — MRSA PCR SCREENING: MRSA by PCR: NEGATIVE

## 2014-08-05 LAB — C-REACTIVE PROTEIN: CRP: 11.2 mg/dL — ABNORMAL HIGH (ref <1.0)

## 2014-08-05 MED ORDER — IBUPROFEN 600 MG PO TABS
600.0000 mg | ORAL_TABLET | Freq: Three times a day (TID) | ORAL | Status: DC
  Administered 2014-08-05 – 2014-08-06 (×3): 600 mg via ORAL
  Filled 2014-08-05: qty 3
  Filled 2014-08-05 (×5): qty 1

## 2014-08-05 MED ORDER — GADOBENATE DIMEGLUMINE 529 MG/ML IV SOLN
30.0000 mL | Freq: Once | INTRAVENOUS | Status: AC | PRN
  Administered 2014-08-05: 26 mL via INTRAVENOUS

## 2014-08-05 MED ORDER — ENOXAPARIN SODIUM 40 MG/0.4ML ~~LOC~~ SOLN
40.0000 mg | SUBCUTANEOUS | Status: DC
  Administered 2014-08-05 – 2014-08-06 (×2): 40 mg via SUBCUTANEOUS
  Filled 2014-08-05 (×3): qty 0.4

## 2014-08-05 NOTE — Progress Notes (Signed)
CRITICAL VALUE ALERT  Critical value received: trop 2.71  Date of notification:  08/05/2014  Time of notification:  1355  Critical value read back: yes  Nurse who received alert:  Oralia RudFred RN  MD notified (1st page):  Azalee CourseMeng Hao PA  Time of first page: 1440  MD notified (2nd page):  Time of second page:  Responding MD: PA Azalee CourseMeng Hao  Time MD responded:  [phone]

## 2014-08-05 NOTE — Progress Notes (Signed)
Patient Name: Perry [name] Date of Encounter: 08/05/2014  Primary Cardiologist: new   Principal Problem:   NSTEMI (non-ST elevated myocardial infarction) Active Problems:   Elevated troponin   Strep pharyngitis   Chest pain at rest    SUBJECTIVE  Spanish speaking male, does understand some English. Has intermittent CP for 2 days, last episode started 20 min ago. Denies any SOB. Still has sore throat.   CURRENT MEDS . amoxicillin  500 mg Oral Q12H  . enoxaparin (LOVENOX) injection  40 mg Subcutaneous Q24H  . sodium chloride  3 mL Intravenous Q12H    OBJECTIVE  Filed Vitals:   08/04/14 2230 08/05/14 0007 08/05/14 0413 08/05/14 0800  BP: 109/74   110/72  Pulse: 70   69  Temp:  97.6 F (36.4 C) 98.8 F (37.1 C)   TempSrc:  Oral Oral   Resp: 20   16  Height:      Weight:      SpO2: 99%   98%    Intake/Output Summary (Last 24 hours) at 08/05/14 1131 Last data filed at 08/05/14 1100  Gross per 24 hour  Intake      3 ml  Output    450 ml  Net   -447 ml   Filed Weights   08/04/14 1811  Weight: 156 lb (70.761 kg)    PHYSICAL EXAM  General: Pleasant, NAD. Neuro: Alert and oriented X 3. Moves all extremities spontaneously. Psych: Normal affect. HEENT:  Normal  Neck: Supple without bruits or JVD. Lungs:  Resp regular and unlabored, CTA. Heart: RRR no s3, s4, or murmurs. Abdomen: Soft, non-tender, non-distended, BS + x 4.  Extremities: No clubbing, cyanosis or edema. DP/PT/Radials 2+ and equal bilaterally.  Accessory Clinical Findings  CBC  Recent Labs  08/04/14 1820  WBC 8.3  HGB 14.8  HCT 42.1  MCV 80.5  PLT 324   Basic Metabolic Panel  Recent Labs  08/04/14 1820  NA 135  K 3.7  CL 102  CO2 22  GLUCOSE 126*  BUN 11  CREATININE 0.79  CALCIUM 9.3   Cardiac Enzymes  Recent Labs  08/04/14 2036 08/05/14 0026 08/05/14 0535  TROPONINI 2.92* 3.22* 2.26*    TELE NSR with HR 60s    ECG  No new EKG  Echocardiogram  08/05/2014  LV EF: 45% -  50%  ------------------------------------------------------------------- Indications:   Chest pain 786.51.  ------------------------------------------------------------------- History:  PMH: No prior cardiac history. Risk factors: Former smoker.  ------------------------------------------------------------------- Study Conclusions  - Left ventricle: The cavity size was normal. Wall thickness was normal. Systolic function was mildly reduced. The estimated ejection fraction was in the range of 45% to 50%. Left ventricular diastolic function parameters were normal. - Mitral valve: There was mild regurgitation.    Radiology/Studies  Dg Chest 2 View  08/04/2014   CLINICAL DATA:  24 year old male with chest pain  EXAM: CHEST  2 VIEW  COMPARISON:  Radiograph dated 07/28/2013  FINDINGS: The heart size and mediastinal contours are within normal limits. Both lungs are clear. The visualized skeletal structures are unremarkable.  IMPRESSION: No active cardiopulmonary disease.   Electronically Signed   By: Elgie CollardArash  Radparvar M.D.   On: 08/04/2014 19:25    ASSESSMENT AND PLAN  55M p/w 3d sore throat, fevers, and CP and is found to have + strep and + Tn c/w myopericarditis  1. Possible perimyocarditis vs ischemia  - intermittent chest pain for last 2 days.   - EKG showed diffuse J point  elevation without reciprocal changes, and Q wave in inferior lead. Q wave present on prior EKG and along with some J point elevation in prior EKG in 2015  - pending cardiac MRI to r/o perimyocarditis, will add cholchicine if confirm inflammation  - echo 08/04/2014 EF 45-50%, mild MR  - if no sign of perimyocarditis on cardiac MRI, may consider cardiac catheterization. Other differential include rheumatic fever, although low likelihood  Addendum: Cardiac MRI negative for myopericarditis, EF 47% mild diffuse hypokinesis no discreet RWMA, further workup or recommendation per  MD  2. +strep throat 1 month ago which recurred in the last few days   Signed, Azalee Course PA-C Pager: [phone] As above, patient seen and examined. 24 year old admitted with fever, strep pharyngitis and pleuritic chest pain. Troponin elevated. Echocardiogram shows mildly reduced LV function. MRI shows mildly reduced LV function with no myocarditis or infarct. Although not seen on MRI, elevated troponin most likely represents inflammatory component related to streptococcal infection particularly given clinical presentation. Agree with therapy for Streptococcus. I am not convinced this represents rheumatic fever as patient does not have a rash, CNS manifestations of arthritis. ID consult is pending. Would treat with non-steroidal to see if this improves his symptoms. We can consider a cardiac CT next week to exclude coronary disease although I think this is very unlikely. [name]

## 2014-08-05 NOTE — Progress Notes (Signed)
  Echocardiogram 2D Echocardiogram has been performed.  Perry SavoyCasey N Chavon [name] 08/05/2014, 10:55 AM

## 2014-08-05 NOTE — Consult Note (Signed)
[facility]    Date of Admission:  08/04/2014   Day 1 IM penicillin G  Reason for Consult: Evaluation for possible acute rheumatic fever Referring Physician: Dr. Onalee Huaavid Tat  Principal Problem:   NSTEMI (non-ST elevated myocardial infarction) Active Problems:   Elevated troponin   Strep pharyngitis   Chest pain at rest   . enoxaparin (LOVENOX) injection  40 mg Subcutaneous Q24H  . ibuprofen  600 mg Oral TID  . sodium chloride  3 mL Intravenous Q12H    Recommendations: 1. His GAS pharyngitis has been adequately treated 2. Agree with trial of nonsteroidal anti-inflammatory therapy  Assessment: This is an usual case of Group A Streptococcal pharyngitis, pleuritic chest pain, and low grade fevers, all collectively suggestive of rheumatic fever. However, we do not believe this is true acute rheumatic fever for several reasons. Firstly, despite his pleuritic chest pain and elevated troponins, a cardiac MRI did not confirm carditis (a test with sensitivity for carditis approaching 100%). Secondly, the timing of the GAS pharyngitis and pleuritic chest pain is inconsistent with acute rheumatic fever as this is an autoimmune phenomena, typically taking between 3-4 weeks after onset of the acute streptococcal infection to manifest. His pharyngitis 6 weeks prior was GAS negative on screening which is a highly sensitive test. Thirdly, carditis and the minor Jones criteria (in this case fevers and elevated inflammatory markers) is non-specific and often go hand-in-hand; the patient does not have any of the more specific findings of Sydenham's chorea, erythema marginatum, or subcutaneous nodules. Despite these reasons, the relationship between his GAS pharyngitis and pleuritic chest pain/troponin elevation eludes us. Regardless, Perry [name] is being appropriately treated for his GAS as he has received 1200 Units of IM Pencillin; thus he does not require further  antibiotic therapy.   HPI: Perry [name] is a 24 y.o. male admitted 7/7 for GAS pharyngitis, chest pain and elevated troponins. The pharyngitis began 4 days ago, for which he has been taking amoxicillin and prednisone from a friend since that time without much relief. He reports a fever of 102 at that time. Yesterday, he began experiencing 7/10 pleuritic chest pain, worse on his right side, without positional changes. He denies shortness of breath, DOE, or edema. He denies choreic movements, subcutaneous nodules, or athritis. Notably, he had a similar episode of pharyngitis in May; GAS screen was negative at that time. Last night his troponins peaked at 3.22, and cardiac MRI performed 7/8 revealed no myopericarditis.  Review of Systems: Per HPI.  History reviewed. No pertinent past medical history.  History  Substance Use Topics  . Smoking status: Former Games developermoker  . Smokeless tobacco: Not on file  . Alcohol Use: 0.0 oz/week    0 Standard drinks or equivalent per week     Comment: occasionally    Family History  Problem Relation Age of Onset  . Hypertension Paternal Uncle    No Known Allergies  OBJECTIVE: Blood pressure 112/87, pulse 69, temperature 98.6 F (37 C), temperature source Oral, resp. rate 18, height 5\' 5"  (1.651 m), weight 70.761 kg (156 lb), SpO2 99 %. General: Lying in bed HEENT: Tonsils touching with white exudate. Tender anterior cervical lympadenopathy. Dentition healthy. Skin: Slightly diaphoretic. No erythematous lesions. No subcutaneous nodules. Lungs: CTAB with normal work of breathing Cor: RRR with nl s1/s2 without murmurs, gallops or rubs. Abdomen: Soft, normoactive BS, NTTP.  Lab Results Lab Results  Component Value Date  WBC 8.3 08/04/2014   HGB 14.8 08/04/2014   HCT 42.1 08/04/2014   MCV 80.5 08/04/2014   PLT 324 08/04/2014    Lab Results  Component Value Date   CREATININE 0.79 08/04/2014   BUN 11 08/04/2014   NA 135 08/04/2014   K 3.7  08/04/2014   CL 102 08/04/2014   CO2 22 08/04/2014    Lab Results  Component Value Date   ALT 27 06/16/2014   AST 22 06/16/2014   ALKPHOS 63 06/16/2014   BILITOT 0.5 06/16/2014     Microbiology: Recent Results (from the past 240 hour(s))  Rapid strep screen     Status: Abnormal   Collection Time: 08/04/14  8:02 PM  Result Value Ref Range Status   Streptococcus, Group A Screen (Direct) POSITIVE (A) NEGATIVE Final  MRSA PCR Screening     Status: None   Collection Time: 08/04/14 11:37 PM  Result Value Ref Range Status   MRSA by PCR NEGATIVE NEGATIVE Final    Comment:        The GeneXpert MRSA Assay (FDA approved for NASAL specimens only), is one component of a comprehensive MRSA colonization surveillance program. It is not intended to diagnose MRSA infection nor to guide or monitor treatment for MRSA infections.    Thank you for the interesting consult.  Selina Cooley, MD [facility] 08/05/2014, 1:33 PM   Addendum: I have seen and examined Perry [name] with my resident, Dr. Earnest Conroy. He has acute group A streptococcal pharyngitis and has been treated appropriately. It is not entirely clear to me how to explain the concurrent onset of chest pain and elevated troponins. For the reasons listed above I do not believe this is actual acute rheumatic fever. Nonetheless, a trial of nonsteroidal anti-inflammatory treatment is reasonable. He states that he is feeling better today. I would consider discharge soon. Please call if we can be of further assistance.  Cliffton Asters, MD [facility] Infect[facility] Disease [facility] [phone] pager   [phone] cell 08/05/2014, 4:41 PM

## 2014-08-05 NOTE — ED Provider Notes (Signed)
CSN: [id number]     Arrival date & time 08/04/14  1800 History   First MD Initiated Contact with Patient 08/04/14 1901     Chief Complaint  Patient presents with  . Chest Pain     (Consider location/radiation/quality/duration/timing/severity/associated sxs/prior Treatment) HPI Patient presents to the emergency department with sore throat and also states that he developed chest pain this evening.  The patient states mainly midsternal that radiates to the right exhibits nothing seems make his chest pain, worse.  He states swallowing makes his throat pain increase.  Patient states that he was recently seen for an upper respiratory illness.  At the end of May.  The patient states that he has not had any shortness of breath, nausea, vomiting, weakness, dizziness, headache, blurred vision, rash, abdominal pain, back pain, incontinence, dysuria, fever or syncope.  Patient states that nothing seems to make his condition, better.  Patient states he did not take any medications prior to arrival History reviewed. No pertinent past medical history. History reviewed. No pertinent past surgical history. History reviewed. No pertinent family history. History  Substance Use Topics  . Smoking status: Never Smoker   . Smokeless tobacco: Not on file  . Alcohol Use: No    Review of Systems  All other systems negative except as documented in the HPI. All pertinent positives and negatives as reviewed in the HPI.  Allergies  Review of patient's allergies indicates no known allergies.  Home Medications   Prior to Admission medications   Not on File   BP 109/74 mmHg  Pulse 70  Temp(Src) 97.6 F (36.4 C) (Oral)  Resp 20  Ht 5\' 5"  (1.651 m)  Wt 156 lb (70.761 kg)  BMI 25.96 kg/m2  SpO2 99% Physical Exam  Constitutional: He is oriented to person, place, and time. He appears well-developed and well-nourished. No distress.  HENT:  Head: Normocephalic and atraumatic.  Mouth/Throat: Uvula is midline.  No trismus in the jaw. No uvula swelling. Posterior oropharyngeal edema and posterior oropharyngeal erythema present. No oropharyngeal exudate.  Eyes: Pupils are equal, round, and reactive to light.  Neck: Normal range of motion. Neck supple.  Cardiovascular: Normal rate, regular rhythm and normal heart sounds.  Exam reveals no gallop and no friction rub.   No murmur heard. Pulmonary/Chest: Effort normal and breath sounds normal. No respiratory distress. He has no wheezes.  Musculoskeletal: He exhibits no edema.  Neurological: He is alert and oriented to person, place, and time. He exhibits normal muscle tone. Coordination normal.  Skin: Skin is warm and dry. No rash noted. No erythema.  Psychiatric: He has a normal mood and affect. His behavior is normal.  Nursing note and vitals reviewed.   ED Course  Procedures (including critical care time) Labs Review Labs Reviewed  RAPID STREP SCREEN (NOT AT [facility]) - Abnormal; Notable for the following:    Streptococcus, Group A Screen (Direct) POSITIVE (*)    All other components within normal limits  BASIC METABOLIC PANEL - Abnormal; Notable for the following:    Glucose, Bld 126 (*)    All other components within normal limits  TROPONIN I - Abnormal; Notable for the following:    Troponin I 2.92 (*)    All other components within normal limits  I-STAT TROPOININ, ED - Abnormal; Notable for the following:    Troponin i, poc 1.56 (*)    All other components within normal limits  I-STAT TROPOININ, ED - Abnormal; Notable for the following:    Troponin  i, poc 1.74 (*)    All other components within normal limits  MRSA PCR SCREENING  CBC  URINE RAPID DRUG SCREEN, HOSP PERFORMED  TROPONIN I  TROPONIN I  TROPONIN I    Imaging Review Dg Chest 2 View  08/04/2014   CLINICAL DATA:  24 year old male with chest pain  EXAM: CHEST  2 VIEW  COMPARISON:  Radiograph dated 07/28/2013  FINDINGS: The heart size and mediastinal contours are within normal  limits. Both lungs are clear. The visualized skeletal structures are unremarkable.  IMPRESSION: No active cardiopulmonary disease.   Electronically Signed   By: Elgie Collard M.D.   On: 08/04/2014 19:25     EKG Interpretation   Date/Time:  Thursday August 04 2014 18:08:23 EDT Ventricular Rate:  76 PR Interval:  136 QRS Duration: 104 QT Interval:  370 QTC Calculation: 416 R Axis:   70 Text Interpretation:  Sinus rhythm Normal sinus rhythm ST elevation  consider inferior injury or acute infarct ST elevation throughout, seen on  prior, likely early repolarization, no reciprocal depression Confirmed by  DOCHERTY  MD, MEGAN [phone]) on 08/04/2014 7:07:46 PM      I spoke with cardiology and the [facility].  The patient be admitted to the hospital for possible pericarditis versus myocarditis.  Patient agrees the plan and all questions were answered    Charlestine Night, PA-C 08/06/14 [id number]  Toy Cookey, MD 08/06/14 [phone]

## 2014-08-05 NOTE — Progress Notes (Signed)
         PROGRESS NOTE  Perry [name] MRN:[id number] DOB: 11/23/1990 DOA: 08/04/2014 PCP: No PCP Per Patient  Brief history 24-year-old male with no chronic medical process presented with fever and pharyngitis in combination with pleuritic chest discomfort for 3 days duration. The patient stated that his temperature was as high as 1 and 2.0F at home. Approximately 2 months ago, the patient had a similar illness. He visited the emergency department on 06/16/2014.  Monospot and rapid strep a screen were negative at that time. The patient took some left over prednisone x 4 days from one of his relatives. He improved clinically. Unfortunately his symptoms recurred 3 days prior to this admission. Workup revealed a positive rapid strep screen with low-grade temperatures and emergency department. He was noted to have elevated troponins that peaked at 3.22. EKG showed some increased ST elevation without any reciprocal changes. Cardiology evaluated the patient did not feel that the patient was having an acute coronary syndrome.  Assessment/Plan: Group A streptococcal pharyngitis with elevated troponins -concerned pt has Rheumatic Fever--he has a positive rapid group A strep test -He meets Jones Criteria for Rheumatic fever with fever, signs of carditis--will order CRP and ESR but suspect these will be elevated -received benzathine penicillin 12,000 units IM -Discontinue amoxicillin -consulted ID -will likely need Pen V prophylaxis after d/c if Rheumatic Fever -CT neck soft tissues to r/o abscess if no improvement -check HIV and hep B and C serology -ASO titer Elevated troponins -Appreciate cardiology evaluation -MRI shows slightly decreased EF with diffuse hypokinesis, but no evidence of myopericarditis -Trend troponins -Echocardiogram shows EF 45-50 percent with mild MR -continue ASA   Family Communication:   Wife updated at beside Disposition Plan:   Home when medically  stable     Procedures/Studies: Dg Chest 2 View  08/04/2014   CLINICAL DATA:  24-year-old male with chest pain  EXAM: CHEST  2 VIEW  COMPARISON:  Radiograph dated 07/28/2013  FINDINGS: The heart size and mediastinal contours are within normal limits. Both lungs are clear. The visualized skeletal structures are unremarkable.  IMPRESSION: No active cardiopulmonary disease.   Electronically Signed   By: Arash  Radparvar M.D.   On: 08/04/2014 19:25   Mr Card Morphology Wo/w Cm  08/05/2014   CLINICAL DATA:  R/O Myopericarditis  EXAM: CARDIAC MRI  TECHNIQUE: The patient was scanned on a 1.5 Tesla GE magnet. A dedicated cardiac coil was used. Functional imaging was done using Fiesta sequences. 2,3, and 4 chamber views were done to assess for RWMA's. Modified Simpson's rule using a short axis stack was used to calculate an ejection fraction on a dedicated work station using Circle software. The patient received 26 cc of Multihance. After 10 minutes inversion recovery sequences were used to assess for infiltration and scar tissue.  CONTRAST:  26 cc Multihance  FINDINGS: The LV was mildly dilated with mild diffuse hypokinesis. LA/RA and RV normal in size and function. Aortic root normal. Mitral, aortic and tricuspid valves normal. Trivial pericardial effusion  The quantitative EF was 47% (EDV 117 ESV 62 cc and SV 47 cc) Delayed enhancement images showed no gadolinium uptake in the myocardium or pericardium  IMPRESSION: 1) Mild LVE EF 47% mild diffuse hypokinesis no discreet RWMA  2) No delayed gadolinium uptake no evidence of myopericarditis  3) Trivial pericardial effusion  Peter Nishan   Electronically Signed   By: Peter  Nishan M.D.   On: 08/05/2014 11:53           Subjective: Patient complains of sore throat. Denies any headache, nausea, vomiting, diarrhea, vomiting, dysuria, hematuria, hematochezia, melena. He complains of chest discomfort that is worsened with lying flat. He denies any shortness of breath  presently. Denies any hemoptysis.  Objective: Filed Vitals:   08/04/14 2230 08/05/14 0007 08/05/14 0413 08/05/14 0800  BP: 109/74   110/72  Pulse: 70   69  Temp:  97.6 F (36.4 C) 98.8 F (37.1 C)   TempSrc:  Oral Oral   Resp: 20   16  Height:      Weight:      SpO2: 99%   98%    Intake/Output Summary (Last 24 hours) at 08/05/14 1206 Last data filed at 08/05/14 1100  Gross per 24 hour  Intake      3 ml  Output    450 ml  Net   -447 ml   Weight change:  Exam:   General:  Pt is alert, follows commands appropriately, not in acute distress  HEENT: No icterus, No thrush, shotty anterior cervical adenopathy, [place]/AT  Cardiovascular: RRR, S1/S2, no rubs, no gallops  Respiratory: CTA bilaterally, no wheezing, no crackles, no rhonchi  Abdomen: Soft/+BS, non tender, non distended, no guarding; no hepatomegaly  Extremities: No edema, No lymphangitis, No petechiae, No rashes, no synovitis; no cervical tenderness nodules. No clubbing or cyanosis.  Data Reviewed: Basic Metabolic Panel:  Recent Labs Lab 08/04/14 1820  NA 135  K 3.7  CL 102  CO2 22  GLUCOSE 126*  BUN 11  CREATININE 0.79  CALCIUM 9.3   Liver Function Tests: No results for input(s): AST, ALT, ALKPHOS, BILITOT, PROT, ALBUMIN in the last 168 hours. No results for input(s): LIPASE, AMYLASE in the last 168 hours. No results for input(s): AMMONIA in the last 168 hours. CBC:  Recent Labs Lab 08/04/14 1820  WBC 8.3  HGB 14.8  HCT 42.1  MCV 80.5  PLT 324   Cardiac Enzymes:  Recent Labs Lab 08/04/14 2036 08/05/14 0026 08/05/14 0535  TROPONINI 2.92* 3.22* 2.26*   BNP: Invalid input(s): POCBNP CBG: No results for input(s): GLUCAP in the last 168 hours.  Recent Results (from the past 240 hour(s))  Rapid strep screen     Status: Abnormal   Collection Time: 08/04/14  8:02 PM  Result Value Ref Range Status   Streptococcus, Group A Screen (Direct) POSITIVE (A) NEGATIVE Final  MRSA PCR Screening      Status: None   Collection Time: 08/04/14 11:37 PM  Result Value Ref Range Status   MRSA by PCR NEGATIVE NEGATIVE Final    Comment:        The GeneXpert MRSA Assay (FDA approved for NASAL specimens only), is one component of a comprehensive MRSA colonization surveillance program. It is not intended to diagnose MRSA infection nor to guide or monitor treatment for MRSA infections.      Scheduled Meds: . enoxaparin (LOVENOX) injection  40 mg Subcutaneous Q24H  . sodium chloride  3 mL Intravenous Q12H   Continuous Infusions:    TAT, DAVID, DO  [facility] Pager [phone]  If 7PM-7AM, please contact night-coverage [email or web address] Password [email or web address] 08/05/2014, 12:06 PM   LOS: 1 day   

## 2014-08-06 DIAGNOSIS — I309 Acute pericarditis, unspecified: Secondary | ICD-10-CM

## 2014-08-06 LAB — BASIC METABOLIC PANEL
Anion gap: 9 (ref 5–15)
BUN: 14 mg/dL (ref 6–20)
CO2: 26 mmol/L (ref 22–32)
Calcium: 9.3 mg/dL (ref 8.9–10.3)
Chloride: 101 mmol/L (ref 101–111)
Creatinine, Ser: 0.76 mg/dL (ref 0.61–1.24)
Glucose, Bld: 105 mg/dL — ABNORMAL HIGH (ref 65–99)
Potassium: 3.2 mmol/L — ABNORMAL LOW (ref 3.5–5.1)
Sodium: 136 mmol/L (ref 135–145)

## 2014-08-06 LAB — ANTISTREPTOLYSIN O TITER: ASO: 150 IU/mL (ref 0.0–200.0)

## 2014-08-06 LAB — HEPATITIS C ANTIBODY

## 2014-08-06 LAB — HEPATITIS B SURFACE ANTIGEN: HEP B S AG: NEGATIVE

## 2014-08-06 LAB — HIV ANTIBODY (ROUTINE TESTING W REFLEX): HIV Screen 4th Generation wRfx: NONREACTIVE

## 2014-08-06 MED ORDER — FAMOTIDINE 20 MG PO TABS: 20.0000 mg | ORAL_TABLET | Freq: Two times a day (BID) | ORAL | Status: AC

## 2014-08-06 MED ORDER — IBUPROFEN 600 MG PO TABS: 600.0000 mg | ORAL_TABLET | Freq: Three times a day (TID) | ORAL | Status: DC

## 2014-08-06 MED ORDER — POTASSIUM CHLORIDE CRYS ER 20 MEQ PO TBCR
40.0000 meq | EXTENDED_RELEASE_TABLET | Freq: Once | ORAL | Status: AC
  Administered 2014-08-06: 40 meq via ORAL
  Filled 2014-08-06: qty 2

## 2014-08-06 NOTE — Care Management Note (Signed)
Case Management Note  Patient Details  Name: Michelene GardenerLuis Islas-Meza MRN: [id number] Date of Birth: 08-12-1990  Subjective/Objective:                  Group A streptococcal pharyngitis with elevated troponins  Elevated troponins/Acute Pericarditis  Action/Plan: CM spoke to pt and family at the bedside. [name] and gave pamphlet. Patient scheduled for follow up appointment for PCP on 08/11/14 @ 9am and information provided to the patient. Pt going home on ibuprofen and famotidine which he denies needing assistance with. Pt denies further needs for discharge. No further CM needs communicated.   Expected Discharge Date:  08/06/14            Expected Discharge Plan:  Home/Self Care  In-House Referral:     Discharge planning Services  CM Consult, [facility]igent [facility]  Post Acute Care Choice:    Choice offered to:     DME Arranged:    DME Agency:     HH Arranged:    HH Agency:     Status of Service:  Completed, signed off  Medicare Important Message Given:    Date Medicare IM Given:    Medicare IM give by:    Date Additional Medicare IM Given:    Additional Medicare Important Message give by:     If discussed at Long Length of Stay Meetings, dates discussed:    Additional Comments:  Darcel SmallingAnna C Vlasta Baskin, RN 08/06/2014, 1:42 PM

## 2014-08-06 NOTE — Progress Notes (Addendum)
Subjective:  No more CP. No SOB. No knee pain. Feeling better.  Objective:  Vital Signs in the last 24 hours: Temp:  [97.8 F (36.6 C)-98.6 F (37 C)] 98.3 F (36.8 C) (07/09 0800) Pulse Rate:  [46-87] 87 (07/09 0900) Resp:  [14-26] 14 (07/09 0900) BP: (90-120)/(43-87) 120/85 mmHg (07/09 0900) SpO2:  [95 %-100 %] 99 % (07/09 0900)  Intake/Output from previous day: 07/08 0701 - 07/09 0700 In: -  Out: 300 [Urine:300]   Physical Exam: General: Well developed, well nourished, in no acute distress. Head:  Normocephalic and atraumatic. Lungs: Clear to auscultation and percussion. Heart: Normal S1 and S2.  No murmur, rubs or gallops.  Abdomen: soft, non-tender, positive bowel sounds. Extremities: No clubbing or cyanosis. No edema. Neurologic: Alert and oriented x 3.    Lab Results:  Recent Labs  08/04/14 1820  WBC 8.3  HGB 14.8  PLT 324    Recent Labs  08/04/14 1820 08/06/14 0230  NA 135 136  K 3.7 3.2*  CL 102 101  CO2 22 26  GLUCOSE 126* 105*  BUN 11 14  CREATININE 0.79 0.76    Recent Labs  08/05/14 0535 08/05/14 1130  TROPONINI 2.26* 2.71*    Imaging: Dg Chest 2 View  08/04/2014   CLINICAL DATA:  24 year old male with chest pain  EXAM: CHEST  2 VIEW  COMPARISON:  Radiograph dated 07/28/2013  FINDINGS: The heart size and mediastinal contours are within normal limits. Both lungs are clear. The visualized skeletal structures are unremarkable.  IMPRESSION: No active cardiopulmonary disease.   Electronically Signed   By: Anner Crete M.D.   On: 08/04/2014 19:25   Mr Card Morphology Wo/w Cm  08/05/2014   CLINICAL DATA:  R/O Myopericarditis  EXAM: CARDIAC MRI  TECHNIQUE: The patient was scanned on a 1.5 Tesla GE magnet. A dedicated cardiac coil was used. Functional imaging was done using Fiesta sequences. 2,3, and 4 chamber views were done to assess for RWMA's. Modified Simpson's rule using a short axis stack was used to calculate an ejection fraction on  a dedicated work Conservation officer, nature. The patient received 26 cc of Multihance. After 10 minutes inversion recovery sequences were used to assess for infiltration and scar tissue.  CONTRAST:  26 cc Multihance  FINDINGS: The LV was mildly dilated with mild diffuse hypokinesis. LA/RA and RV normal in size and function. Aortic root normal. Mitral, aortic and tricuspid valves normal. Trivial pericardial effusion  The quantitative EF was 47% (EDV 117 ESV 62 cc and SV 47 cc) Delayed enhancement images showed no gadolinium uptake in the myocardium or pericardium  IMPRESSION: 1) Mild LVE EF 47% mild diffuse hypokinesis no discreet RWMA  2) No delayed gadolinium uptake no evidence of myopericarditis  3) Trivial pericardial effusion  Jenkins Rouge   Electronically Signed   By: Jenkins Rouge M.D.   On: 08/05/2014 11:53   Personally viewed.   Telemetry: No VT or other adverse rhythms Personally viewed.   EKG:  NSR, Diffuse J point elevation with subtle PR depression.   Cardiac Studies:  EF 47% on MRI. Trivial effusion.   Scheduled Meds: . enoxaparin (LOVENOX) injection  40 mg Subcutaneous Q24H  . ibuprofen  600 mg Oral TID  . potassium chloride  40 mEq Oral Once  . sodium chloride  3 mL Intravenous Q12H   Continuous Infusions:  PRN Meds:.antiseptic oral rinse, HYDROcodone-acetaminophen, magic mouthwash w/lidocaine   Assessment/Plan:   24 year old with strep  infection of throat with subsequent myopericarditis, elevated troponin.   -Agree that elevated troponin is secondary to pericarditis (elevated ESR and CRP, ECG changes).  -MRI reassuring with no scar -EF can be mildly reduced in this situation. Hopeful resolution with time. -ID consult reviewed - no strong evidence of rheumatic heart dz -Continue antiinflammatory ibuprofen 600 TID for 10 days. -If recurrence of pericarditis, consider colchicine.  -Since symptoms are resolved, no CP, and no signs of adverse rhythms, I would be  comfortable with DC home.  -I do not feel strongly that CT of coronary arteries is needed prior to DC. Does not appear to be ACS based upon clinical picture.  -Troponin is decreased down from 3.22. -Will give KCL 7mq.   Will sign off. Please call if any questions.    SKAINS, MMcDade7/09/2014, 9:18 AM

## 2014-08-06 NOTE — Discharge Summary (Signed)
Physician Discharge Summary  Perry [name] WLN:[id number] DOB: [birth year] DOA: 08/04/2014  PCP: No PCP Per Patient  Admit date: 08/04/2014 Discharge date: 08/06/2014  Recommendations for Outpatient Follow-up:  1. Pt will need to follow up with PCP in 2 weeks post discharge 2. Please obtain BMP in one week Discharge Diagnoses:  Group A streptococcal pharyngitis with elevated troponins -concerned pt has Rheumatic Fever--he has a positive rapid group A strep test -He meets Jones Criteria for Rheumatic fever with fever, signs of carditis--will order CRP and ESR but suspect these will be elevated -received benzathine penicillin 12,000 units IM on 08/04/14 -Discontinue amoxicillin -consulted ID--did not feel pt had rheumatic fever -CT neck soft tissues to r/o abscess if no improvement--neck pain slowly improving -check HIV and hep B and C serology--negative -ASO titer--150 Elevated troponins/Acute Pericarditis -Appreciate cardiology evaluation -MRI shows slightly decreased EF with diffuse hypokinesis, but no evidence of myopericarditis -Trend troponins--elevated but stable -Echocardiogram shows EF 45-50 percent with mild MR -stable from cardiology standpoint to d/c home with ibuprofen 600 mg tid x 9 more days -instructed pt to drink plenty of water -pepcid 97m bid during course of ibuprofen  Discharge Condition: stable  Disposition: home  Diet:regular Wt Readings from Last 3 Encounters:  08/04/14 70.761 kg (156 lb)  07/27/13 77.111 kg (170 lb)    History of present illness:  24year old male with no chronic medical process presented with fever and pharyngitis in combination with pleuritic chest discomfort for 3 days duration. The patient stated that his temperature was as high as 102.71F at home. Approximately 2 months ago, the patient had a similar illness. He visited the emergency department on 06/16/2014. Monospot and rapid strep a screen were negative at that time. The patient  took some left over prednisone x 4 days from one of his relatives. He improved clinically. Unfortunately his symptoms recurred 3 days prior to this admission. Workup revealed a positive rapid strep screen with low-grade temperatures and emergency department. He was noted to have elevated troponins that peaked at 3.22. EKG showed some increased ST elevation without any reciprocal changes. Cardiology evaluated the patient did not feel that the patient was having an acute coronary syndrome.  The patient was seen by infectious disease who did not feel the patient had rheumatic fever. 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 The patient was straight with ibuprofen with improving pain. The patient was discharged in stable condition with instructions to take ibuprofen for 9 additional days to complete a ten-day course.  Consultants: ID Cardiology  Discharge Exam: Filed Vitals:   08/06/14 1000  BP: 113/68  Pulse: 70  Temp:   Resp: 15   Filed Vitals:   08/06/14 0700 08/06/14 0800 08/06/14 0900 08/06/14 1000  BP: 108/66 108/72 120/85 113/68  Pulse: 67 46 87 70  Temp:  98.3 F (36.8 C)    TempSrc:  Oral    Resp: 15 23 14 15   Height:      Weight:      SpO2: 95% 100% 99% 99%   General: A&O x 3, NAD, pleasant, cooperative Cardiovascular: RRR, no rub, no gallop, no S3 Respiratory: CTAB, no wheeze, no rhonchi Abdomen:soft, nontender, nondistended, positive bowel sounds Extremities: No edema, No lymphangitis, no petechiae  Discharge Instructions      Discharge Instructions    Diet - low sodium heart healthy    Complete by:  As directed      Discharge instructions    Complete by:  As directed  You can take over the counter ibuprofen 281m--take 3 tablets every 8 hours for next nine days You can take over the counter ibuprofen 2066mas directed above instead of ibuprofen 60061mablets Drink plenty of water Take pepcid 57m56mo times daily for next 10 days      Increase activity slowly    Complete by:  As directed             Medication List    TAKE these medications        famotidine 20 MG tablet  Commonly known as:  PEPCID  Take 1 tablet (20 mg total) by mouth 2 (two) times daily.     ibuprofen 600 MG tablet  Commonly known as:  ADVIL,MOTRIN  Take 1 tablet (600 mg total) by mouth 3 (three) times daily. X 9 additional days         The results of significant diagnostics from this hospitalization (including imaging, microbiology, ancillary and laboratory) are listed below for reference.    Significant Diagnostic Studies: Dg Chest 2 View  08/04/2014   CLINICAL DATA:  24 y20r old male with chest pain  EXAM: CHEST  2 VIEW  COMPARISON:  Radiograph dated 07/28/2013  FINDINGS: The heart size and mediastinal contours are within normal limits. Both lungs are clear. The visualized skeletal structures are unremarkable.  IMPRESSION: No active cardiopulmonary disease.   Electronically Signed   By: ArasAnner Crete.   On: 08/04/2014 19:25   Mr Card Morphology Wo/w Cm  08/05/2014   CLINICAL DATA:  R/O Myopericarditis  EXAM: CARDIAC MRI  TECHNIQUE: The patient was scanned on a 1.5 Tesla GE magnet. A dedicated cardiac coil was used. Functional imaging was done using Fiesta sequences. 2,3, and 4 chamber views were done to assess for RWMA's. Modified Simpson's rule using a short axis stack was used to calculate an ejection fraction on a dedicated work statConservation officer, naturee patient received 26 cc of Multihance. After 10 minutes inversion recovery sequences were used to assess for infiltration and scar tissue.  CONTRAST:  26 cc Multihance  FINDINGS: The LV was mildly dilated with mild diffuse hypokinesis. LA/RA and RV normal in size and function. Aortic root normal. Mitral, aortic and tricuspid valves normal. Trivial pericardial effusion  The quantitative EF was 47% (EDV 117 ESV 62 cc and SV 47 cc) Delayed enhancement images showed no gadolinium  uptake in the myocardium or pericardium  IMPRESSION: 1) Mild LVE EF 47% mild diffuse hypokinesis no discreet RWMA  2) No delayed gadolinium uptake no evidence of myopericarditis  3) Trivial pericardial effusion  PeteJenkins Rougelectronically Signed   By: PeteJenkins Rouge.   On: 08/05/2014 11:53     Microbiology: Recent Results (from the past 240 hour(s))  Rapid strep screen     Status: Abnormal   Collection Time: 08/04/14  8:02 PM  Result Value Ref Range Status   Streptococcus, Group A Screen (Direct) POSITIVE (A) NEGATIVE Final  MRSA PCR Screening     Status: None   Collection Time: 08/04/14 11:37 PM  Result Value Ref Range Status   MRSA by PCR NEGATIVE NEGATIVE Final    Comment:        The GeneXpert MRSA Assay (FDA approved for NASAL specimens only), is one component of a comprehensive MRSA colonization surveillance program. It is not intended to diagnose MRSA infection nor to guide or monitor treatment for MRSA infections.      Labs: Basic Metabolic Panel:  Recent Labs  Lab 08/04/14 1820 08/06/14 0230  NA 135 136  K 3.7 3.2*  CL 102 101  CO2 22 26  GLUCOSE 126* 105*  BUN 11 14  CREATININE 0.79 0.76  CALCIUM 9.3 9.3   Liver Function Tests: No results for input(s): AST, ALT, ALKPHOS, BILITOT, PROT, ALBUMIN in the last 168 hours. No results for input(s): LIPASE, AMYLASE in the last 168 hours. No results for input(s): AMMONIA in the last 168 hours. CBC:  Recent Labs Lab 08/04/14 1820  WBC 8.3  HGB 14.8  HCT 42.1  MCV 80.5  PLT 324   Cardiac Enzymes:  Recent Labs Lab 08/04/14 2036 08/05/14 0026 08/05/14 0535 08/05/14 1130  TROPONINI 2.92* 3.22* 2.26* 2.71*   BNP: Invalid input(s): POCBNP CBG: No results for input(s): GLUCAP in the last 168 hours.  Time coordinating discharge:  Greater than 30 minutes  Signed:  Roney Youtz, DO [facility] Pager: [phone] 08/06/2014, 11:47 AM

## 2014-08-06 NOTE — Progress Notes (Signed)
Received order to dc patient home.Discharged education given and patient verbalized understanding

## 2014-08-09 ENCOUNTER — Ambulatory Visit: Payer: Self-pay | Admitting: Family Medicine

## 2014-08-11 ENCOUNTER — Inpatient Hospital Stay: Payer: Self-pay | Admitting: Internal Medicine

## 2016-02-24 ENCOUNTER — Emergency Department (HOSPITAL_COMMUNITY)
Admission: EM | Admit: 2016-02-24 | Discharge: 2016-02-24 | Disposition: A | Discharge status: Home / Self Care | Payer: Self-pay | Attending: Emergency Medicine | Admitting: Emergency Medicine

## 2016-02-24 ENCOUNTER — Encounter (HOSPITAL_COMMUNITY): Payer: Self-pay | Admitting: *Deleted

## 2016-02-24 ENCOUNTER — Emergency Department (HOSPITAL_COMMUNITY): Payer: Self-pay

## 2016-02-24 DIAGNOSIS — R6889 Other general symptoms and signs: Secondary | ICD-10-CM

## 2016-02-24 DIAGNOSIS — R509 Fever, unspecified: Secondary | ICD-10-CM | POA: Insufficient documentation

## 2016-02-24 DIAGNOSIS — I252 Old myocardial infarction: Secondary | ICD-10-CM | POA: Insufficient documentation

## 2016-02-24 DIAGNOSIS — Z87891 Personal history of nicotine dependence: Secondary | ICD-10-CM | POA: Insufficient documentation

## 2016-02-24 DIAGNOSIS — R0789 Other chest pain: Secondary | ICD-10-CM | POA: Insufficient documentation

## 2016-02-24 MED ORDER — NAPROXEN 500 MG PO TABS: 500.0000 mg | ORAL_TABLET | Freq: Two times a day (BID) | ORAL | 0 refills | Status: AC

## 2016-02-24 MED ORDER — OSELTAMIVIR PHOSPHATE 75 MG PO CAPS
75.0000 mg | ORAL_CAPSULE | Freq: Two times a day (BID) | ORAL | 0 refills | Status: AC
Start: 2016-02-24 — End: ?

## 2016-02-24 MED ORDER — HYDROCOD POLST-CPM POLST ER 10-8 MG/5ML PO SUER
5.0000 mL | Freq: Once | ORAL | Status: AC
  Administered 2016-02-24: 5 mL via ORAL
  Filled 2016-02-24: qty 5

## 2016-02-24 MED ORDER — BENZONATATE 100 MG PO CAPS
100.0000 mg | ORAL_CAPSULE | Freq: Three times a day (TID) | ORAL | 0 refills | Status: AC
Start: 2016-02-24 — End: ?

## 2016-02-24 NOTE — ED Triage Notes (Signed)
PT to ED c/o cough, chest pain with coughing, and rhinitis since yesterday.  Temp 99.4, but took advil 2 hours ago.

## 2016-02-24 NOTE — ED Provider Notes (Signed)
[facility]-EMERGENCY DEPT Provider Note   CSN: [id number] Arrival date & time: 02/24/16  1603    By signing my name below, I, Valentino SaxonBianca Contreras, attest that this documentation has been prepared under the direction and in the presence of Kerrie BuffaloHope Neese, NP. Electronically Signed: Valentino SaxonBianca Contreras, ED Scribe. 02/24/16. 5:43 PM.  History   Chief Complaint Chief Complaint  Patient presents with  . Cough  . Chest Pain   The history is provided by the patient. No language interpreter was used.  Chest Pain   Associated symptoms include cough and a fever. Pertinent negatives include no abdominal pain, no back pain, no headaches, no nausea and no vomiting.   HPI Comments: Perry [name] is a 26 y.o. male who presents to the Emergency Department complaining of moderate, persistent cough onset last night. Pt notes he has been coughing up some yellow sputum. He states having mild chest pain and notes it is exacerbated with coughing. He reports associated sore throat, nasal congestion, subjective fever followed by chills and generalized muscle aches. Pt's temperature in the ED today was 99.4. Pt notes recent sick contact with his son at home. Per pt, he states his son was seen by a PCP and was diagnosed with an upper respiratory infection but was not placed on antibiotics. He notes taking tylenol and OTC cough medicine with minimal relief. Denies nausea, vomiting, abdominal pain, back pain, HA, neck pain.    History reviewed. No pertinent past medical history.  Patient Active Problem List   Diagnosis Date Noted  . Acute pericarditis 08/06/2014  . NSTEMI (non-ST elevated myocardial infarction) (HCC) 08/04/2014  . Elevated troponin 08/04/2014  . Strep pharyngitis 08/04/2014  . Chest pain at rest 08/04/2014  . Pneumothorax, traumatic 07/28/2013  . Splenic laceration 07/28/2013  . Left shoulder pain 07/28/2013  . Injury of omentum 07/28/2013  . Fall 07/27/2013    History reviewed. No pertinent surgical  history.     Home Medications    Prior to Admission medications   Medication Sig Start Date End Date Taking? Authorizing Provider  benzonatate (TESSALON) 100 MG capsule Take 1 capsule (100 mg total) by mouth every 8 (eight) hours. 02/24/16   Hope Orlene OchM Neese, NP  famotidine (PEPCID) 20 MG tablet Take 1 tablet (20 mg total) by mouth 2 (two) times daily. 08/06/14   Catarina Hartshornavid Tat, MD  ibuprofen (ADVIL,MOTRIN) 600 MG tablet Take 1 tablet (600 mg total) by mouth 3 (three) times daily. X 9 additional days 08/06/14   Catarina Hartshornavid Tat, MD  naproxen (NAPROSYN) 500 MG tablet Take 1 tablet (500 mg total) by mouth 2 (two) times daily. 02/24/16   Hope Orlene OchM Neese, NP  oseltamivir (TAMIFLU) 75 MG capsule Take 1 capsule (75 mg total) by mouth every 12 (twelve) hours. 02/24/16   Hope Orlene OchM Neese, NP    Family History Family History  Problem Relation Age of Onset  . Hypertension Paternal Uncle     Social History Social History  Substance Use Topics  . Smoking status: Former Games developermoker  . Smokeless tobacco: Never Used  . Alcohol use 0.0 oz/week     Comment: occasionally     Allergies   Patient has no known allergies.   Review of Systems Review of Systems  Constitutional: Positive for chills and fever.  HENT: Positive for congestion and sore throat.   Respiratory: Positive for cough.   Cardiovascular: Chest pain: with cough.  Gastrointestinal: Negative for abdominal pain, nausea and vomiting.  Musculoskeletal: Positive for myalgias. Negative for back pain  and neck pain.  Skin: Negative for rash.  Neurological: Negative for headaches.  Psychiatric/Behavioral: Negative for confusion.     Physical Exam Updated Vital Signs BP 111/68 (BP Location: Right Arm)   Pulse 82   Temp 98.4 F (36.9 C) (Oral)   Resp 18   Ht 5\' 4"  (1.626 m)   Wt 77.1 kg   SpO2 96%   BMI 29.18 kg/m   Physical Exam  Constitutional: He appears well-developed and well-nourished. No distress.  HENT:  Head: Normocephalic and atraumatic.    Right Ear: Tympanic membrane normal.  Left Ear: Tympanic membrane normal.  Nose: Rhinorrhea present.  Mouth/Throat: Uvula is midline and mucous membranes are normal. Posterior oropharyngeal erythema present.  Eyes: Conjunctivae and EOM are normal. No scleral icterus.  Neck: Normal range of motion.  No meningeal signs.   Cardiovascular: Normal rate and regular rhythm.   Tachycardic.   Pulmonary/Chest: Effort normal. No respiratory distress. He has no wheezes. He has no rales.  Abdominal: Soft. There is no tenderness.  Musculoskeletal: He exhibits no tenderness.  No CVA tenderness.   Lymphadenopathy:    He has no cervical adenopathy.  Neurological: He is alert. Coordination normal.  Skin: Skin is warm and dry. No rash noted. He is not diaphoretic. No erythema.  Psychiatric: He has a normal mood and affect. His behavior is normal.  Nursing note and vitals reviewed.    ED Treatments / Results   DIAGNOSTIC STUDIES: Oxygen Saturation is 98% on RA, normal by my interpretation.    COORDINATION OF CARE: 5:40 PM Discussed treatment plan with pt at bedside which includes chest imaging and cough medicine and pt agreed to plan.   Radiology Dg Chest 2 View  Result Date: 02/24/2016 CLINICAL DATA:  Patient with chest pain and cough. EXAM: CHEST  2 VIEW COMPARISON:  Chest radiograph 08/04/2014. FINDINGS: Stable cardiac and mediastinal contours. No consolidative pulmonary opacities. No pleural effusion or pneumothorax. Regional skeleton is unremarkable. IMPRESSION: No active cardiopulmonary disease. Electronically Signed   By: Annia Belt M.D.   On: 02/24/2016 18:55    Procedures Procedures (including critical care time)  Medications Ordered in ED Medications  chlorpheniramine-HYDROcodone (TUSSIONEX) 10-8 MG/5ML suspension 5 mL (5 mLs Oral Given 02/24/16 1814)     Initial Impression / Assessment and Plan / ED Course  I have reviewed the triage vital signs and the nursing  notes.  Pertinent imaging results that were available during my care of the patient were reviewed by me and considered in my medical decision making (see chart for details).  SUBJECTIVE:  Perry [name] is a 26 y.o. male who present complaining of flu-like symptoms: fevers, chills, myalgias, congestion, sore throat and cough for 24 hours. Denies dyspnea or wheezing.  OBJECTIVE: Appears moderately ill but not toxic; temperature as noted in vitals. Ears normal. Throat and pharynx with mild erythema.  Neck supple. No adenopathy in the neck. Sinuses non tender. The chest is clear.  ASSESSMENT: Influenza  PLAN: Symptomatic therapy suggested: rest, increase fluids, gargle prn for sore throat, use mist of vaporizer prn and return prn if symptoms persist or worsen.   Final Clinical Impressions(s) / ED Diagnoses   Final diagnoses:  Flu-like symptoms  Chest wall pain    New Prescriptions Discharge Medication List as of 02/24/2016  7:21 PM    START taking these medications   Details  benzonatate (TESSALON) 100 MG capsule Take 1 capsule (100 mg total) by mouth every 8 (eight) hours., Starting Sat 02/24/2016, Print  naproxen (NAPROSYN) 500 MG tablet Take 1 tablet (500 mg total) by mouth 2 (two) times daily., Starting Sat 02/24/2016, Print    oseltamivir (TAMIFLU) 75 MG capsule Take 1 capsule (75 mg total) by mouth every 12 (twelve) hours., Starting Sat 02/24/2016, Print       I personally performed the services described in this documentation, which was scribed in my presence. The recorded information has been reviewed and is accurate.     [address] [place], NP 02/25/16 0246    Rolland Porter, MD 03/07/16 1537

## 2017-07-22 ENCOUNTER — Other Ambulatory Visit: Payer: Self-pay

## 2017-07-22 ENCOUNTER — Emergency Department (HOSPITAL_COMMUNITY)
Admission: EM | Admit: 2017-07-22 | Discharge: 2017-07-23 | Disposition: A | Discharge status: Home / Self Care | Payer: Self-pay | Attending: Emergency Medicine | Admitting: Emergency Medicine

## 2017-07-22 DIAGNOSIS — F17210 Nicotine dependence, cigarettes, uncomplicated: Secondary | ICD-10-CM | POA: Insufficient documentation

## 2017-07-22 DIAGNOSIS — J209 Acute bronchitis, unspecified: Secondary | ICD-10-CM | POA: Insufficient documentation

## 2017-07-23 ENCOUNTER — Other Ambulatory Visit: Payer: Self-pay

## 2017-07-23 ENCOUNTER — Emergency Department (HOSPITAL_COMMUNITY): Payer: Self-pay

## 2017-07-23 ENCOUNTER — Encounter (HOSPITAL_COMMUNITY): Payer: Self-pay | Admitting: Emergency Medicine

## 2017-07-23 LAB — GROUP A STREP BY PCR: Group A Strep by PCR: NOT DETECTED

## 2017-07-23 LAB — CBC WITH DIFFERENTIAL/PLATELET
Abs Immature Granulocytes: 0.1 10*3/uL (ref 0.0–0.1)
Basophils Absolute: 0.1 10*3/uL (ref 0.0–0.1)
Basophils Relative: 1 %
EOS ABS: 1.2 10*3/uL — AB (ref 0.0–0.7)
Eosinophils Relative: 10 %
HEMATOCRIT: 44.8 % (ref 39.0–52.0)
Hemoglobin: 14.6 g/dL (ref 13.0–17.0)
IMMATURE GRANULOCYTES: 0 %
LYMPHS PCT: 25 %
Lymphs Abs: 3.2 10*3/uL (ref 0.7–4.0)
MCH: 27.7 pg (ref 26.0–34.0)
MCHC: 32.6 g/dL (ref 30.0–36.0)
MCV: 84.8 fL (ref 78.0–100.0)
Monocytes Absolute: 0.8 10*3/uL (ref 0.1–1.0)
Monocytes Relative: 6 %
NEUTROS PCT: 58 %
Neutro Abs: 7.4 10*3/uL (ref 1.7–7.7)
Platelets: 369 10*3/uL (ref 150–400)
RBC: 5.28 MIL/uL (ref 4.22–5.81)
RDW: 12.9 % (ref 11.5–15.5)
WBC: 12.7 10*3/uL — ABNORMAL HIGH (ref 4.0–10.5)

## 2017-07-23 LAB — BASIC METABOLIC PANEL
ANION GAP: 11 (ref 5–15)
BUN: 12 mg/dL (ref 6–20)
CO2: 24 mmol/L (ref 22–32)
CREATININE: 1.07 mg/dL (ref 0.61–1.24)
Calcium: 9.2 mg/dL (ref 8.9–10.3)
Chloride: 105 mmol/L (ref 98–111)
GFR calc non Af Amer: >60 mL/min (ref >60)
Glucose, Bld: 92 mg/dL (ref 70–99)
Potassium: 3.7 mmol/L (ref 3.5–5.1)
Sodium: 140 mmol/L (ref 135–145)

## 2017-07-23 LAB — TROPONIN I: Troponin I: <0.03 ng/mL (ref <0.03)

## 2017-07-23 MED ORDER — IBUPROFEN 600 MG PO TABS: 600.0000 mg | ORAL_TABLET | Freq: Three times a day (TID) | ORAL | 0 refills | Status: AC | PRN

## 2017-07-23 MED ORDER — IPRATROPIUM-ALBUTEROL 0.5-2.5 (3) MG/3ML IN SOLN
3.0000 mL | Freq: Once | RESPIRATORY_TRACT | Status: AC
  Administered 2017-07-23: 3 mL via RESPIRATORY_TRACT
  Filled 2017-07-23: qty 3

## 2017-07-23 MED ORDER — ALBUTEROL SULFATE HFA 108 (90 BASE) MCG/ACT IN AERS: 1.0000 | INHALATION_SPRAY | Freq: Four times a day (QID) | RESPIRATORY_TRACT | 0 refills | Status: AC | PRN

## 2017-07-23 MED ORDER — DOXYCYCLINE HYCLATE 100 MG PO CAPS: 100.0000 mg | ORAL_CAPSULE | Freq: Two times a day (BID) | ORAL | 0 refills | Status: AC

## 2017-07-23 MED ORDER — KETOROLAC TROMETHAMINE 30 MG/ML IJ SOLN
30.0000 mg | Freq: Once | INTRAMUSCULAR | Status: AC
  Administered 2017-07-23: 30 mg via INTRAMUSCULAR
  Filled 2017-07-23: qty 1

## 2017-07-23 NOTE — ED Provider Notes (Signed)
[facility] EMERGENCY DEPARTMENT Provider Note   CSN: [id number] Arrival date & time: 07/22/17  2352     History   Chief Complaint Chief Complaint  Patient presents with  . Cough    HPI Perry [name] is a 27 y.o. male.  Level 5 caveat for language barrier.  Patient presents with central chest pain and productive cough for the past 2 days.  States his cough is productive of yellow mucus.  His chest hurts all the time but is worse with coughing.  Is not had a fever.  He does have some sore throat and runny nose and pain with swallowing.  Denies any shortness of breath, abdominal pain, nausea or vomiting.  Patient has a history significant for myopericarditis 2 years ago in the setting of strep infection.  He also smokes but does not have a history of asthma.  The history is provided by the patient.  Cough  Associated symptoms include chest pain, sore throat and shortness of breath. Pertinent negatives include no myalgias.    History reviewed. No pertinent past medical history.  Patient Active Problem List   Diagnosis Date Noted  . Acute pericarditis 08/06/2014  . NSTEMI (non-ST elevated myocardial infarction) (HCC) 08/04/2014  . Elevated troponin 08/04/2014  . Strep pharyngitis 08/04/2014  . Chest pain at rest 08/04/2014  . Pneumothorax, traumatic 07/28/2013  . Splenic laceration 07/28/2013  . Left shoulder pain 07/28/2013  . Injury of omentum 07/28/2013  . Fall 07/27/2013    History reviewed. No pertinent surgical history.      Home Medications    Prior to Admission medications   Medication Sig Start Date End Date Taking? Authorizing Provider  benzonatate (TESSALON) 100 MG capsule Take 1 capsule (100 mg total) by mouth every 8 (eight) hours. 02/24/16   Janne Napoleon, NP  famotidine (PEPCID) 20 MG tablet Take 1 tablet (20 mg total) by mouth 2 (two) times daily. 08/06/14   Catarina Hartshorn, MD  ibuprofen (ADVIL,MOTRIN) 600 MG tablet Take 1 tablet (600 mg  total) by mouth 3 (three) times daily. X 9 additional days 08/06/14   Tat, Onalee Hua, MD  naproxen (NAPROSYN) 500 MG tablet Take 1 tablet (500 mg total) by mouth 2 (two) times daily. 02/24/16   Janne Napoleon, NP  oseltamivir (TAMIFLU) 75 MG capsule Take 1 capsule (75 mg total) by mouth every 12 (twelve) hours. 02/24/16   Janne Napoleon, NP    Family History Family History  Problem Relation Age of Onset  . Hypertension Paternal Uncle     Social History Social History   Tobacco Use  . Smoking status: Current Every Day Smoker    Types: Cigarettes  . Smokeless tobacco: Never Used  . Tobacco comment: 3 cigs a day  Substance Use Topics  . Alcohol use: Yes    Alcohol/week: 0.0 oz    Comment: occasionally  . Drug use: No     Allergies   Patient has no known allergies.   Review of Systems Review of Systems  Constitutional: Negative for activity change and appetite change.  HENT: Positive for sore throat and trouble swallowing.   Eyes: Negative for visual disturbance.  Respiratory: Positive for cough, chest tightness and shortness of breath.   Cardiovascular: Positive for chest pain.  Gastrointestinal: Negative for abdominal pain, nausea and vomiting.  Genitourinary: Negative for dysuria, hematuria and testicular pain.  Musculoskeletal: Negative for arthralgias and myalgias.  Neurological: Negative for dizziness, weakness and numbness.   all other systems  are negative except as noted in the HPI and PMH.     Physical Exam Updated Vital Signs BP 114/73 (BP Location: Left Arm)   Pulse 82   Temp 98.6 F (37 C) (Oral)   Resp 18   SpO2 97%   Physical Exam  Constitutional: He is oriented to person, place, and time. He appears well-developed and well-nourished. No distress.  HENT:  Head: Normocephalic and atraumatic.  Mouth/Throat: Oropharyngeal exudate present.  Erythematous tonsils with exudates bilaterally, uvula midline  Eyes: Pupils are equal, round, and reactive to light.  Conjunctivae and EOM are normal.  Neck: Normal range of motion. Neck supple.  No meningismus.  Cardiovascular: Normal rate, regular rhythm, normal heart sounds and intact distal pulses.  No murmur heard. Pulmonary/Chest: Effort normal. No respiratory distress. He has wheezes. He exhibits tenderness.  Scattered expiratory wheezing, reproducible central chest tenderness  Abdominal: Soft. There is no tenderness. There is no rebound and no guarding.  Musculoskeletal: Normal range of motion. He exhibits no edema or tenderness.  Neurological: He is alert and oriented to person, place, and time. No cranial nerve deficit. He exhibits normal muscle tone. Coordination normal.  No ataxia on finger to nose bilaterally. No pronator drift. 5/5 strength throughout. CN 2-12 intact.Equal grip strength. Sensation intact.   Skin: Skin is warm.  Psychiatric: He has a normal mood and affect. His behavior is normal.  Nursing note and vitals reviewed.    ED Treatments / Results  Labs (all labs ordered are listed, but only abnormal results are displayed) Labs Reviewed  CBC WITH DIFFERENTIAL/PLATELET - Abnormal; Notable for the following components:      Result Value   WBC 12.7 (*)    Eosinophils Absolute 1.2 (*)    All other components within normal limits  GROUP A STREP BY PCR  BASIC METABOLIC PANEL  TROPONIN I    EKG EKG Interpretation  Date/Time:  Wednesday July 23 2017 03:43:22 EDT Ventricular Rate:  71 PR Interval:    QRS Duration: 107 QT Interval:  369 QTC Calculation: 401 R Axis:   68 Text Interpretation:  Sinus rhythm RSR' in V1 or V2, right VCD or RVH diffuse ST elevation similar to previous Confirmed by Glynn Octave [phone]) on 07/23/2017 3:50:23 AM   Radiology Dg Chest 2 View  Result Date: 07/23/2017 CLINICAL DATA:  Cough and mid chest pain for 3-4 days. EXAM: CHEST - 2 VIEW COMPARISON:  02/24/2016 FINDINGS: The heart size and mediastinal contours are within normal limits. Both  lungs are clear. The visualized skeletal structures are unremarkable. IMPRESSION: No active cardiopulmonary disease. Electronically Signed   By: Tollie Eth M.D.   On: 07/23/2017 00:29    Procedures Procedures (including critical care time)  Medications Ordered in ED Medications  ipratropium-albuterol (DUONEB) 0.5-2.5 (3) MG/3ML nebulizer solution 3 mL (has no administration in time range)     Initial Impression / Assessment and Plan / ED Course  I have reviewed the triage vital signs and the nursing notes.  Pertinent labs & imaging results that were available during my care of the patient were reviewed by me and considered in my medical decision making (see chart for details).    Patient with 2 days of cough with chest pain that is worse with coughing.  No fever.  Chest x-ray is negative for pneumonia.  EKG with ST elevation similar to previous.  Patient given nebulizer for wheezing.  Labs are reassuring with normal troponin.  Will treat with antibiotics for bronchitis.  Discussed with patient needs to stop smoking.  We will also give course of ibuprofen for possible component of pericarditis.  Treat with antibiotics, albuterol, smoking sensation.  Return precautions discussed. Final Clinical Impressions(s) / ED Diagnoses   Final diagnoses:  Acute bronchitis, unspecified organism    ED Discharge Orders    None       Ossie Yebra, Jeannett SeniorStephen, MD 07/23/17 [phone]

## 2017-07-23 NOTE — Discharge Instructions (Addendum)
Stop smoking.  Take the antibiotics as prescribed and use inhaler as needed for difficulty breathing and cough.  Follow-up with your primary doctor.  Return to the ED if you develop new or worsening symptoms.

## 2017-07-23 NOTE — ED Triage Notes (Signed)
Pt states he has had cough that is very strong causing his chest to be sore for 2 days and having production of yellow mucous. No fever.

## 2017-08-24 IMAGING — DX DG CHEST 2V
2 series · 2 of 2 positions shown · non-contrast
Comparison: Chest radiograph 08/04/2014.

CLINICAL DATA: Patient with chest pain and cough.

EXAM:
CHEST  2 VIEW

[chest pa]
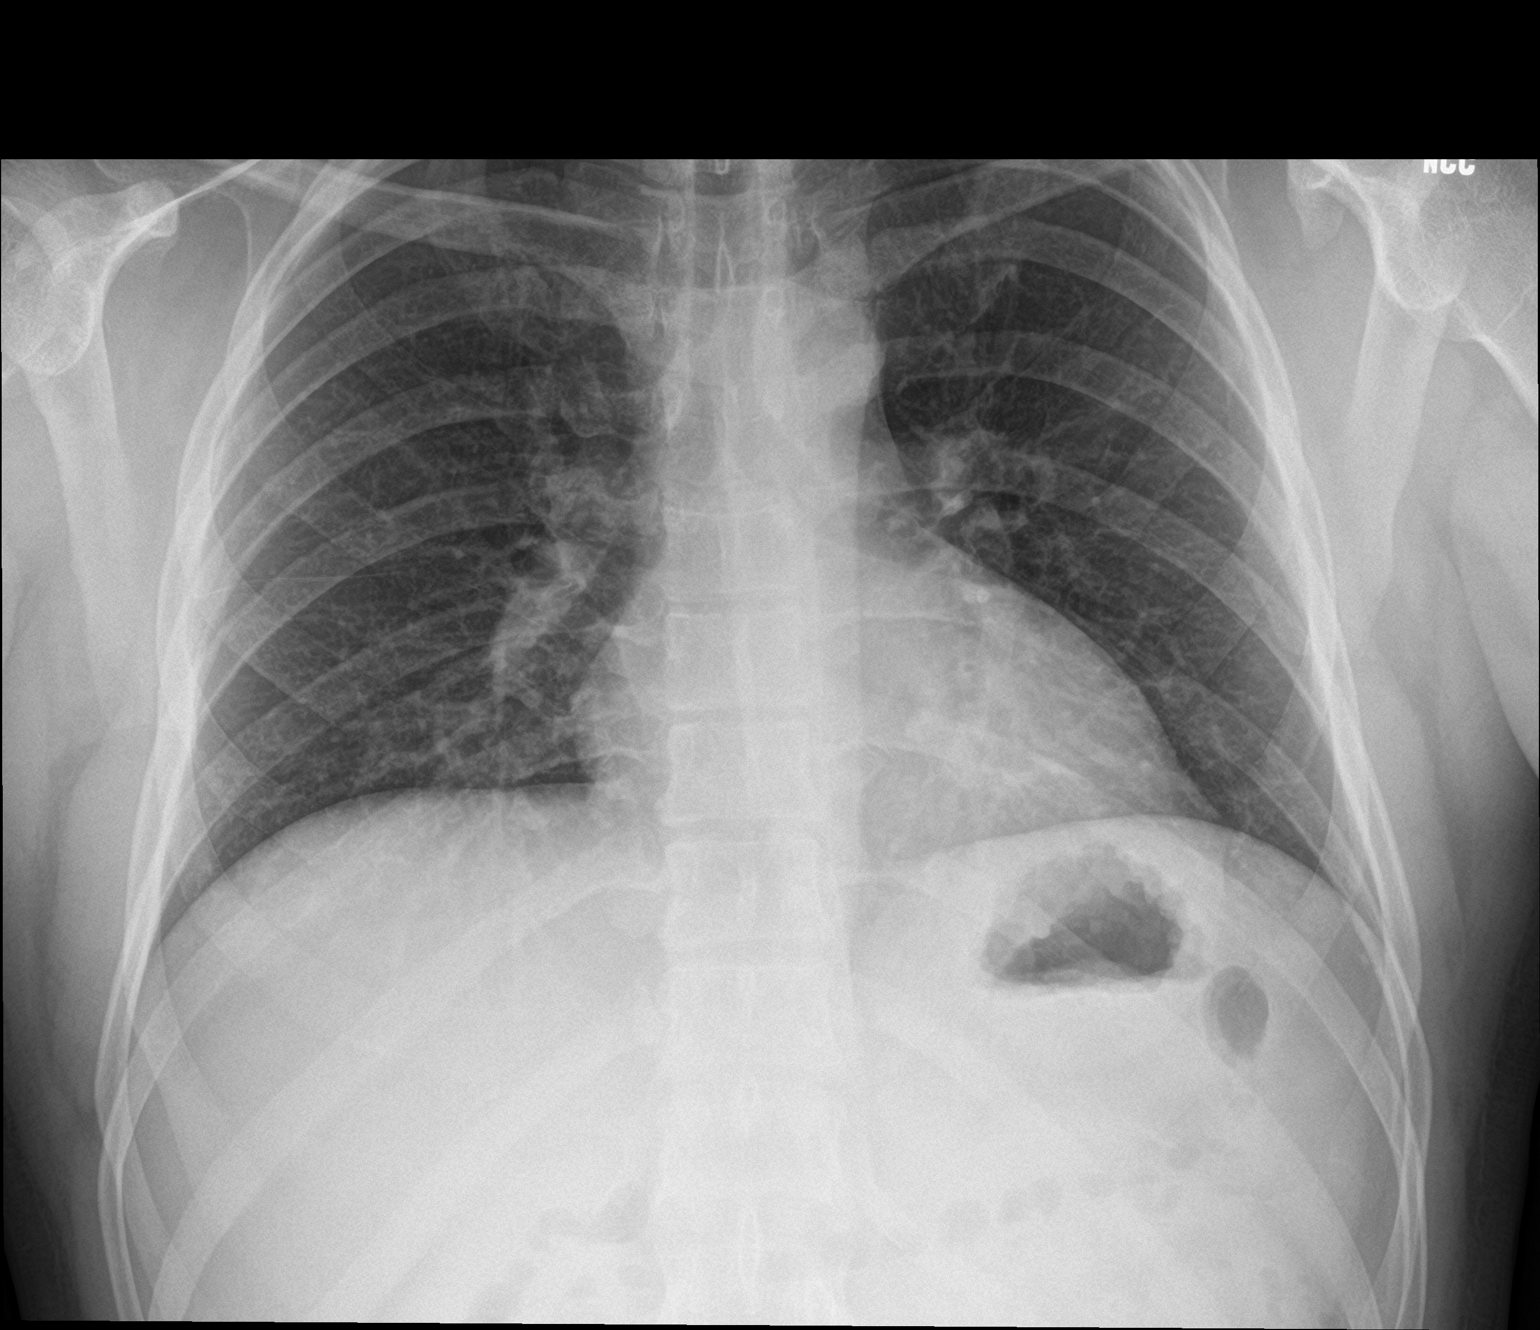

[chest lat]
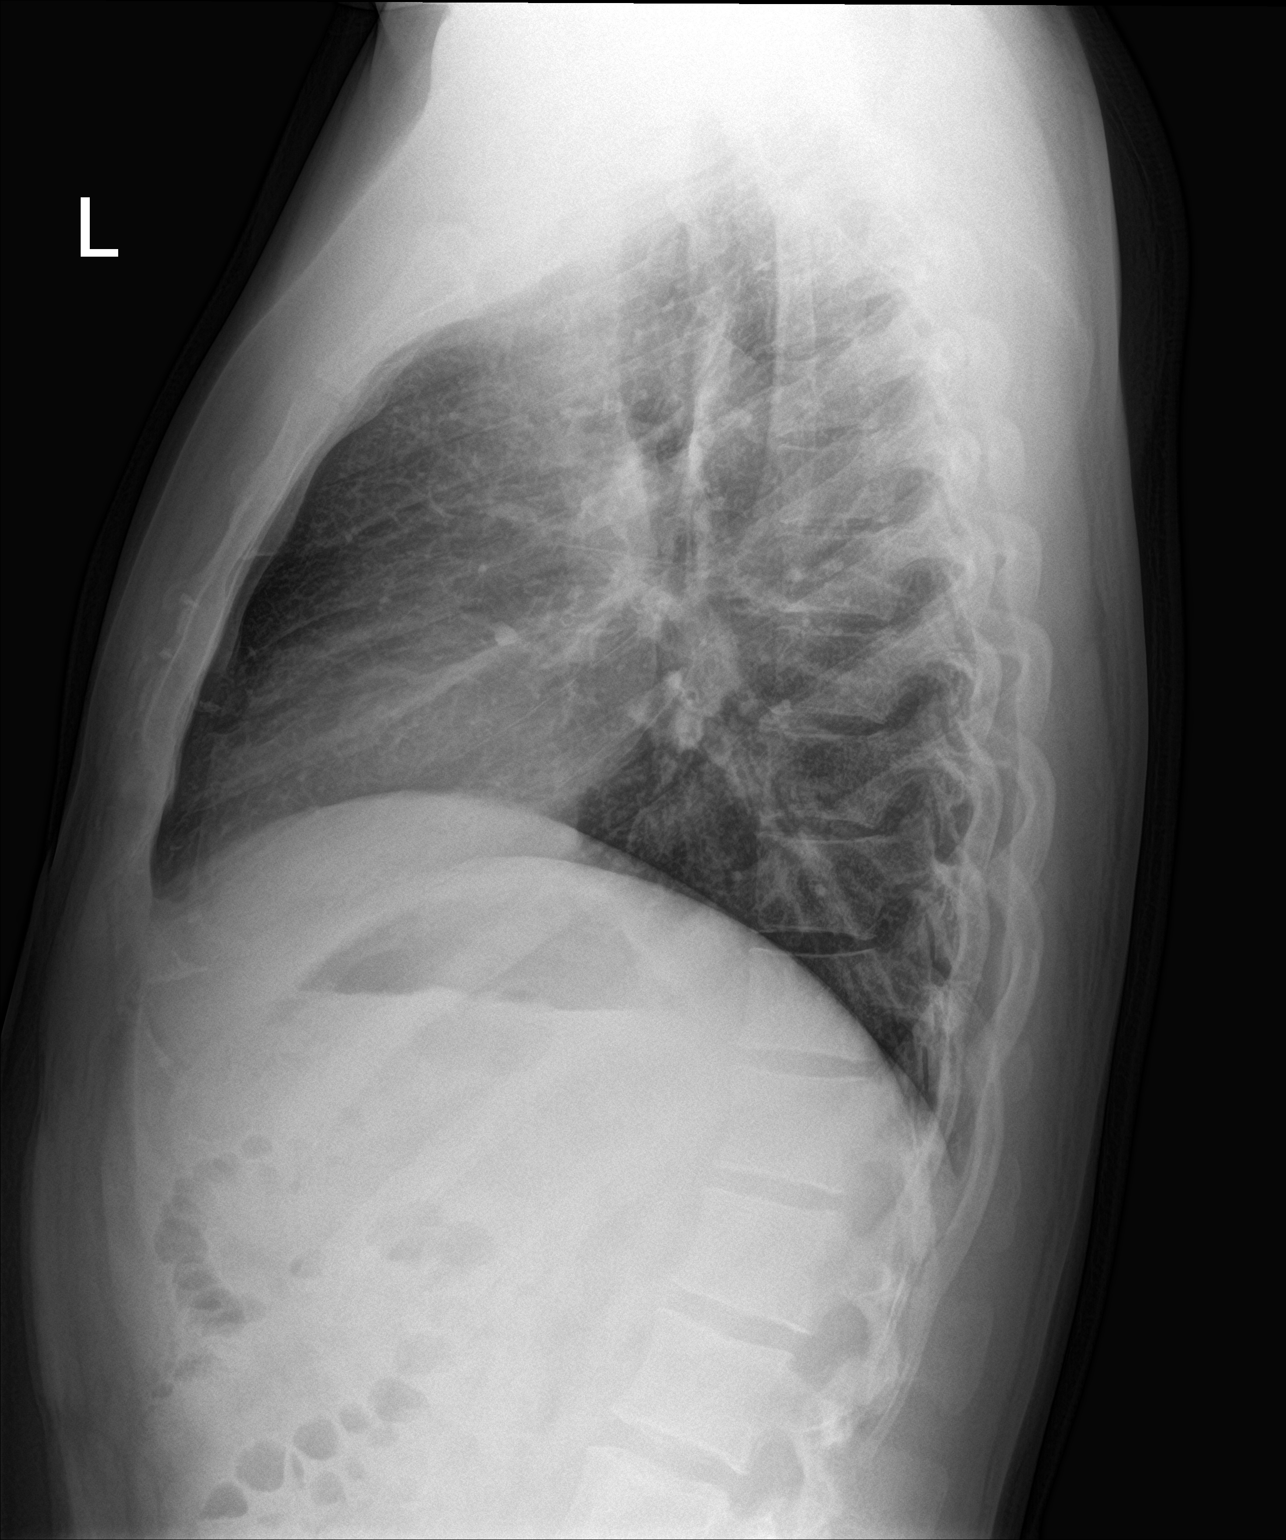

[2 of 2 positions shown; findings below may reference images not displayed]

FINDINGS: Stable cardiac and mediastinal contours. No consolidative pulmonary
opacities. No pleural effusion or pneumothorax. Regional skeleton is
unremarkable.
IMPRESSION: No active cardiopulmonary disease.

## 2019-01-21 IMAGING — CR DG CHEST 2V
2 series · 2 of 2 positions shown · non-contrast
Comparison: 02/24/2016

CLINICAL DATA: Cough and mid chest pain for 3-4 days.

EXAM:
CHEST - 2 VIEW

[chest pa]
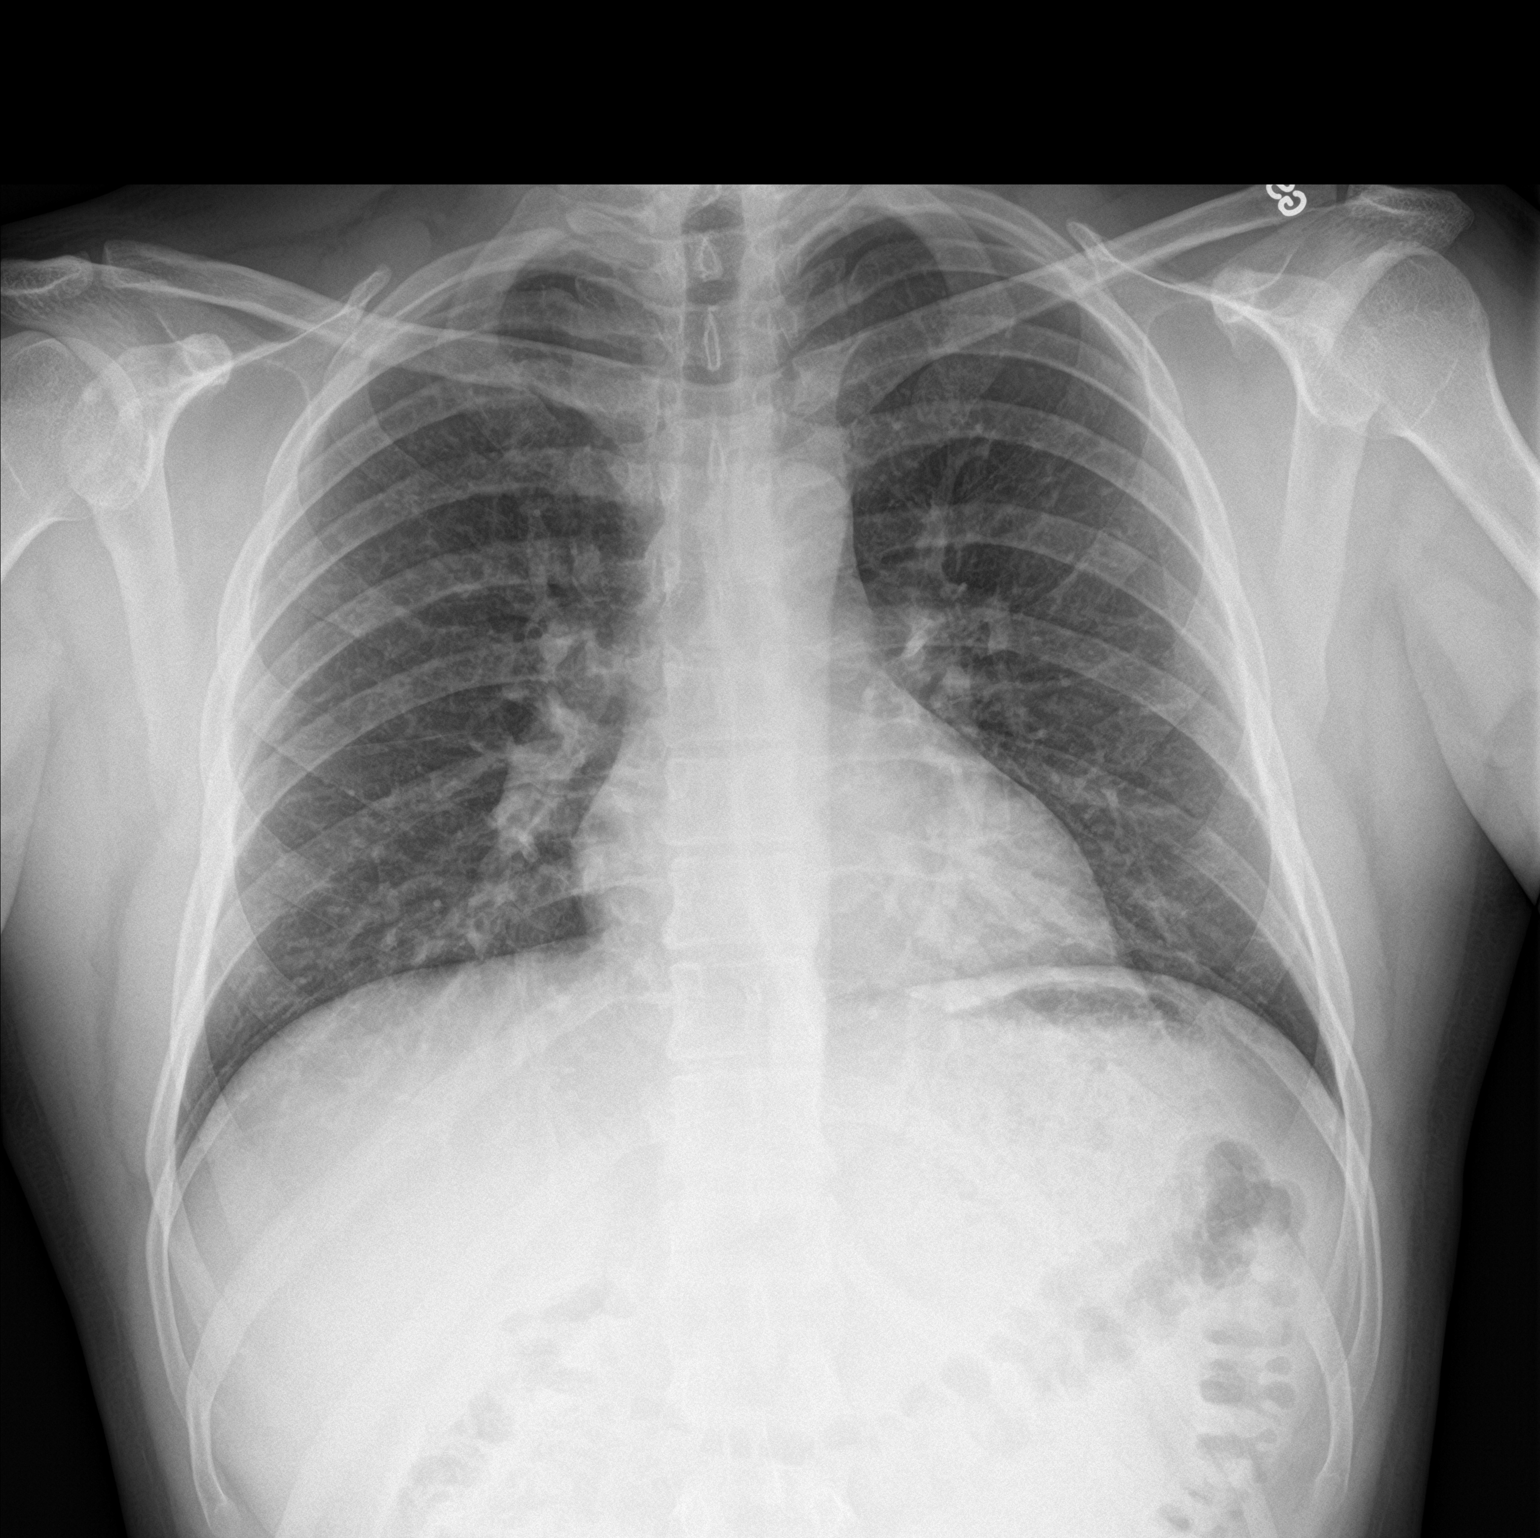

[chest lat]
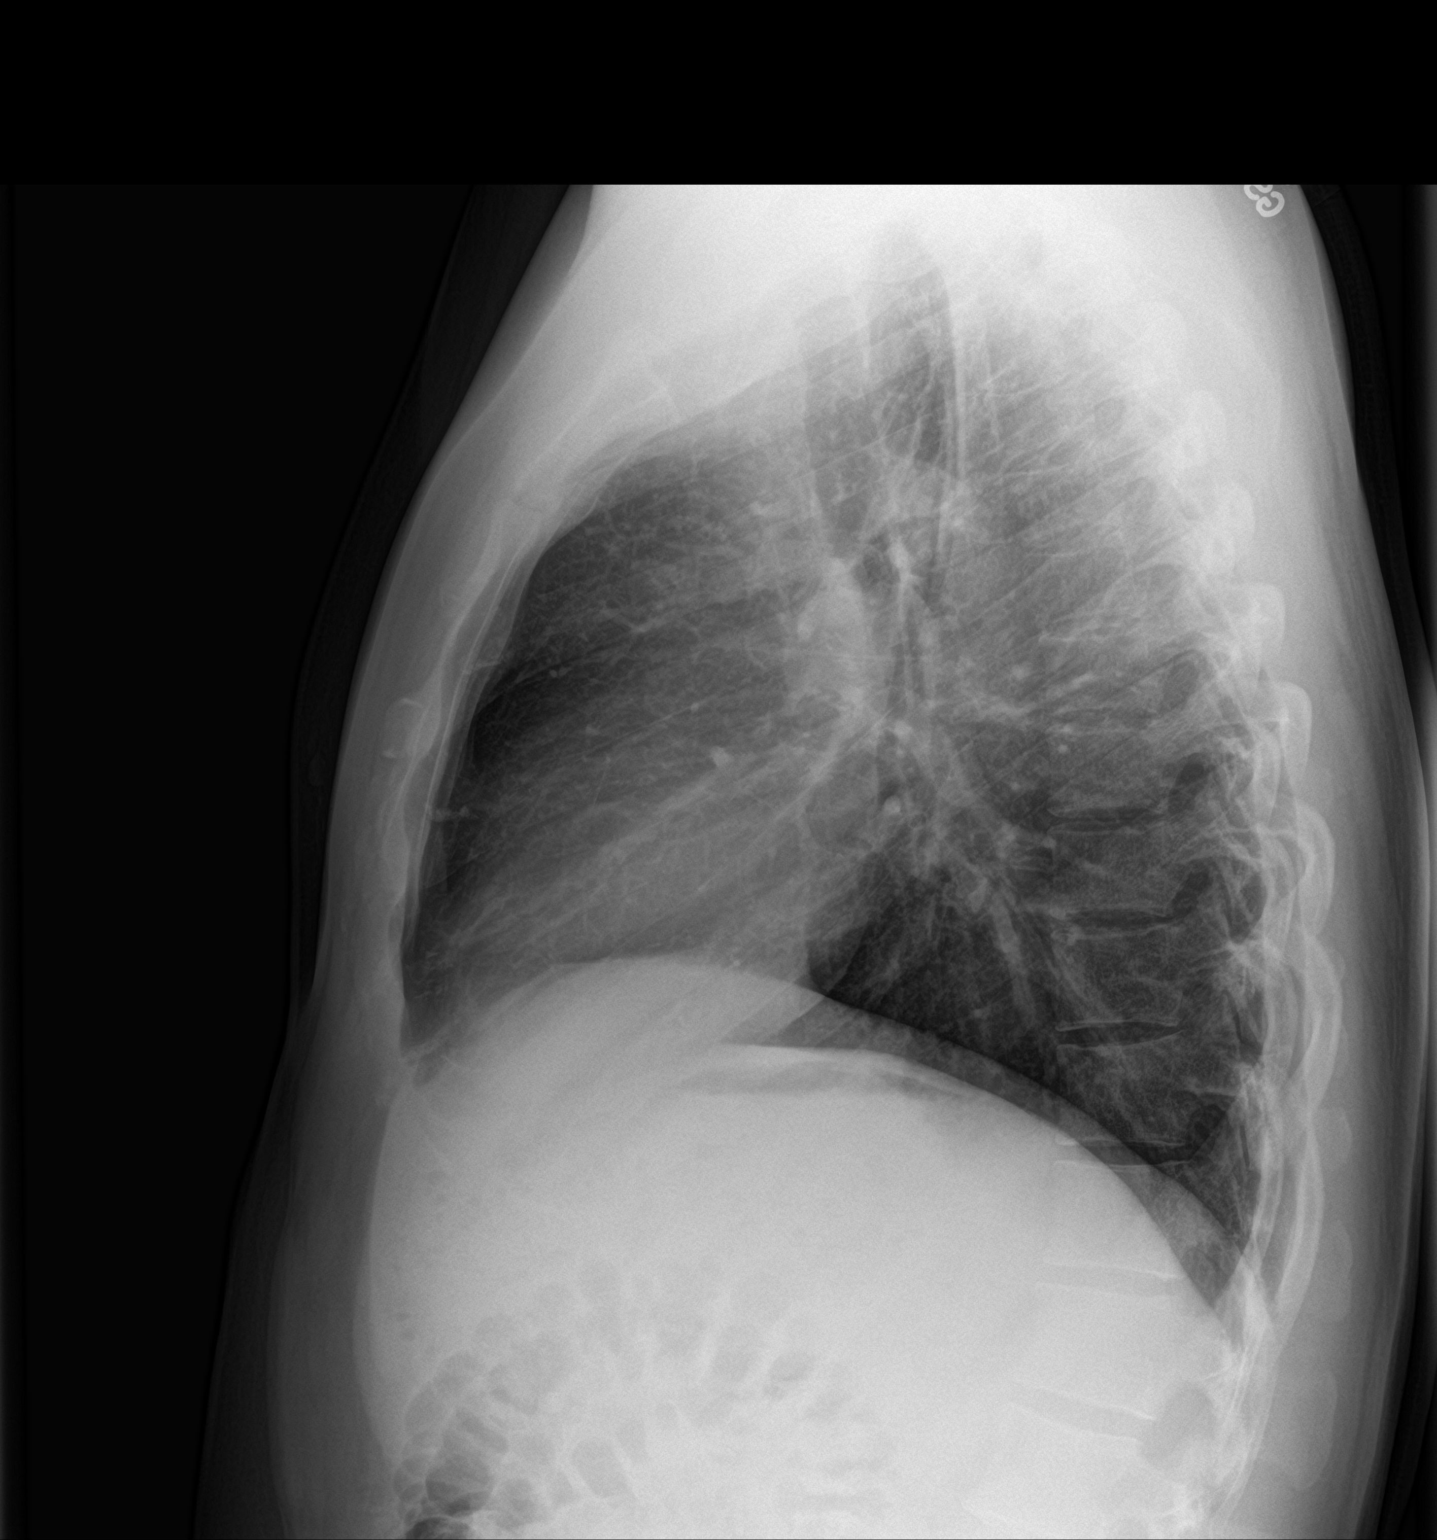

[2 of 2 positions shown; findings below may reference images not displayed]

FINDINGS: The heart size and mediastinal contours are within normal limits.
Both lungs are clear. The visualized skeletal structures are
unremarkable.
IMPRESSION: No active cardiopulmonary disease.
